# Patient Record
Sex: Male | Born: 1960 | Race: White | Hispanic: No | Marital: Married | Smoking: Never smoker
Health system: Southern US, Community
[De-identification: ages and names within clinical notes are randomized; demographics above are authoritative.]

## PROBLEM LIST (undated history)

## (undated) DIAGNOSIS — I2510 Atherosclerotic heart disease of native coronary artery without angina pectoris: Secondary | ICD-10-CM

## (undated) DIAGNOSIS — K219 Gastro-esophageal reflux disease without esophagitis: Secondary | ICD-10-CM

## (undated) DIAGNOSIS — B029 Zoster without complications: Secondary | ICD-10-CM

## (undated) DIAGNOSIS — I10 Essential (primary) hypertension: Secondary | ICD-10-CM

## (undated) DIAGNOSIS — E559 Vitamin D deficiency, unspecified: Secondary | ICD-10-CM

## (undated) DIAGNOSIS — E785 Hyperlipidemia, unspecified: Secondary | ICD-10-CM

## (undated) HISTORY — PX: CARDIAC CATHETERIZATION: SHX172

## (undated) HISTORY — DX: Atherosclerotic heart disease of native coronary artery without angina pectoris: I25.10

## (undated) HISTORY — DX: Gastro-esophageal reflux disease without esophagitis: K21.9

## (undated) HISTORY — DX: Zoster without complications: B02.9

## (undated) HISTORY — PX: VASECTOMY: SHX75

## (undated) HISTORY — DX: Hyperlipidemia, unspecified: E78.5

## (undated) HISTORY — DX: Vitamin D deficiency, unspecified: E55.9

## (undated) HISTORY — DX: Essential (primary) hypertension: I10

## (undated) HISTORY — PX: TONSILLECTOMY: SUR1361

---

## 2006-01-08 HISTORY — PX: CORONARY ANGIOPLASTY: SHX604

## 2014-05-20 ENCOUNTER — Other Ambulatory Visit: Payer: Self-pay | Admitting: Orthopedic Surgery

## 2014-05-20 DIAGNOSIS — M25571 Pain in right ankle and joints of right foot: Secondary | ICD-10-CM

## 2014-05-29 ENCOUNTER — Ambulatory Visit
Admission: RE | Admit: 2014-05-29 | Discharge: 2014-05-29 | Disposition: A | Discharge status: Home / Self Care | Payer: BLUE CROSS/BLUE SHIELD | Source: Ambulatory Visit | Attending: Orthopedic Surgery | Admitting: Orthopedic Surgery

## 2014-05-29 DIAGNOSIS — M25571 Pain in right ankle and joints of right foot: Secondary | ICD-10-CM

## 2014-06-15 ENCOUNTER — Telehealth: Payer: Self-pay

## 2014-06-15 NOTE — Telephone Encounter (Signed)
TRIED CALLING PT TO SEE ABOUT GETTING NOTES AND WHERE TO CALL FOR NOTES ALSO ABOUT FAMILY HISTORY AND MEDICATIONS. NO ANSWER. UNABLE TO LOCATE RECORDS IN OFFICE AND NUMBER LISTED WAS NOT A WORKING NUMBER.

## 2014-06-17 ENCOUNTER — Encounter: Payer: Self-pay | Admitting: Cardiology

## 2014-06-17 ENCOUNTER — Ambulatory Visit (INDEPENDENT_AMBULATORY_CARE_PROVIDER_SITE_OTHER): Payer: BLUE CROSS/BLUE SHIELD | Admitting: Cardiology

## 2014-06-17 VITALS — BP 120/82 | HR 51 | Ht 66.0 in | Wt 154.8 lb

## 2014-06-17 DIAGNOSIS — I10 Essential (primary) hypertension: Secondary | ICD-10-CM | POA: Diagnosis not present

## 2014-06-17 DIAGNOSIS — E785 Hyperlipidemia, unspecified: Secondary | ICD-10-CM | POA: Diagnosis not present

## 2014-06-17 DIAGNOSIS — I2510 Atherosclerotic heart disease of native coronary artery without angina pectoris: Secondary | ICD-10-CM

## 2014-06-17 HISTORY — DX: Hyperlipidemia, unspecified: E78.5

## 2014-06-17 HISTORY — DX: Essential (primary) hypertension: I10

## 2014-06-17 HISTORY — DX: Atherosclerotic heart disease of native coronary artery without angina pectoris: I25.10

## 2014-06-17 MED ORDER — PLAVIX 75 MG PO TABS: 75.0000 mg | ORAL_TABLET | Freq: Every day | ORAL | Status: DC

## 2014-06-17 NOTE — Patient Instructions (Signed)

## 2014-06-17 NOTE — Progress Notes (Addendum)
Cardiology Office Note   Date:  06/19/2014   ID:  Luis Zamora, DOB 17-Apr-1960, MRN [id number]  PCP:  No primary care provider on file.    Chief Complaint  Patient presents with  . New Evaluation    CAD      History of Present Illness: Luis Zamora is a 54 y.o. male who presents for evaluation of CAD.  He has a history of CAD and was treated in [place], [place] where he previously lived.  He moved to [facility] for his job.  He had a stent placed in the LAD 10/2006 due to severe atypical right sided chest pain.  He has done well since then with normal nuclear stress tests since then.  He actively exercises by walking and is going to find a gym.  He denies any chest pain, SOB, DOE, dizziness, palpitations or syncope.  He has some problems with his RLE and has had some mild swelling.      Past Medical History  Diagnosis Date  . Hypertension   . Hyperlipidemia   . Dyslipidemia, goal LDL below 70 06/17/2014  . Benign essential HTN 06/17/2014  . GERD (gastroesophageal reflux disease)   . Vitamin D deficiency   . Shingles   . CAD (coronary artery disease), native coronary artery 06/17/2014    S/p PCI of LAD 2008 in [place].  Nuclear stress test 2013 with no ischemia and EF 64%    Past Surgical History  Procedure Laterality Date  . Cardiac catheterization    . Coronary angioplasty  2008    PCI of LAD [facility]  . Vasectomy    . Tonsillectomy       Current Outpatient Prescriptions  Medication Sig Dispense Refill  . amLODipine (NORVASC) 2.5 MG tablet Take 2.5 mg by mouth daily.  0  . aspirin (BAYER ASPIRIN EC LOW DOSE) 81 MG EC tablet Take 81 mg by mouth daily. Swallow whole.    . Cholecalciferol (VITAMIN D-3) 1000 UNITS CAPS Take 1,000 Units by mouth daily.    . CRESTOR 20 MG tablet Take 20 mg by mouth daily.  0  . niacin (NIASPAN) 500 MG CR tablet Take 500 mg by mouth daily.  0  . PLAVIX 75 MG tablet Take 1 tablet (75 mg total) by mouth daily. 90  tablet 3   No current facility-administered medications for this visit.    Allergies:   Review of patient's allergies indicates no known allergies.    Social History:  The patient  reports that he has never smoked. He does not have any smokeless tobacco history on file. He reports that he drinks alcohol. He reports that he does not use illicit drugs.   Family History:  The patient's family history includes Angina in his father; Dementia in his mother; Heart disease in his father; Hyperlipidemia in his mother.    ROS:  Please see the history of present illness.   Otherwise, review of systems are positive for none.   All other systems are reviewed and negative.    PHYSICAL EXAM: VS:  BP 120/82 mmHg  Pulse 51  Ht 5\' 6"  (1.676 m)  Wt 154 lb 12.8 oz (70.217 kg)  BMI 25.00 kg/m2 , BMI Body mass index is 25 kg/(m^2). GEN: Well nourished, well developed, in no acute distress HEENT: normal Neck: no JVD, carotid bruits, or masses Cardiac: RRR; no murmurs, rubs, or gallops,no edema  Respiratory:  clear to auscultation bilaterally, normal work of breathing GI: soft, nontender, nondistended, + BS MS: no deformity or atrophy Skin: warm and dry, no rash Neuro:  Strength and sensation are intact Psych: euthymic mood, full affect   EKG:   The ekg ordered today demonstrates sinus bradycardia at 51bpm with nonspecific T wave abnormality   Recent Labs: No results found for requested labs within last 365 days.    Lipid Panel No results found for: CHOL, TRIG, HDL, CHOLHDL, VLDL, LDLCALC, LDLDIRECT    Wt Readings from Last 3 Encounters:  06/17/14 154 lb 12.8 oz (70.217 kg)       ASSESSMENT AND PLAN:  1.  ASCAD with prior PCI of the LAD and remains on ASA and Plavix. He has no angina.  He continues to exercise. 2.  Dyslipidemia - continue Crestor/Niacin.  I will get a copy from Dr. Wynelle Link 3.  HTN - well controlled on Amlodipine   Current medicines are reviewed at length with the  patient today.  The patient does not have concerns regarding medicines.  The following changes have been made:  no change  Labs/ tests ordered today: See above Assessment and Plan  Orders Placed This Encounter  Procedures  . EKG 12-Lead     Disposition:   FU with me in 6 months  Signed, Quintella Reichert, MD  06/19/2014 8:01 PM    [facility] [address] [place], [place], [place]  [address] Phone: [phone]; Fax: [phone]

## 2014-06-18 ENCOUNTER — Institutional Professional Consult (permissible substitution): Payer: Self-pay | Admitting: Cardiology

## 2014-06-19 ENCOUNTER — Encounter: Payer: Self-pay | Admitting: Cardiology

## 2014-06-19 DIAGNOSIS — E559 Vitamin D deficiency, unspecified: Secondary | ICD-10-CM | POA: Insufficient documentation

## 2014-06-19 DIAGNOSIS — B029 Zoster without complications: Secondary | ICD-10-CM | POA: Insufficient documentation

## 2014-06-19 DIAGNOSIS — K219 Gastro-esophageal reflux disease without esophagitis: Secondary | ICD-10-CM | POA: Insufficient documentation

## 2014-09-30 ENCOUNTER — Telehealth: Payer: Self-pay

## 2014-09-30 NOTE — Telephone Encounter (Signed)
THe generic for Plavix is fine

## 2014-09-30 NOTE — Telephone Encounter (Signed)
Patient st when he got his stent placed in 2008, his cardiologist told him to "never go off Plavix." Now, his insurance will not pay for name brand. Patient is extremely anxious. He wants to know, from the pharmacist, if the medication is the same or if his stent will be affected from taking generic clopidogrel.   To Kennon Rounds, [facility].

## 2014-09-30 NOTE — Telephone Encounter (Signed)
Patient called in about his Plavix and was told by his insurance company that they will no longer pay for plavix only generic. I explained this is sometimes what happens with insurance companies and he cut me off and just stated that he is uncomfortable with this and wants to know if there is a way to stay on Plavix and not generic. He would like a call back to discuss.

## 2014-09-30 NOTE — Telephone Encounter (Signed)
Discussed with pt.  Okay to change to generic.  Called [facility] pharmacy to generic.

## 2014-12-22 ENCOUNTER — Ambulatory Visit (INDEPENDENT_AMBULATORY_CARE_PROVIDER_SITE_OTHER): Payer: BLUE CROSS/BLUE SHIELD | Admitting: Cardiology

## 2014-12-22 ENCOUNTER — Encounter: Payer: Self-pay | Admitting: Cardiology

## 2014-12-22 VITALS — BP 110/80 | HR 67 | Ht 66.0 in | Wt 160.5 lb

## 2014-12-22 DIAGNOSIS — I10 Essential (primary) hypertension: Secondary | ICD-10-CM

## 2014-12-22 DIAGNOSIS — E785 Hyperlipidemia, unspecified: Secondary | ICD-10-CM | POA: Diagnosis not present

## 2014-12-22 DIAGNOSIS — I2510 Atherosclerotic heart disease of native coronary artery without angina pectoris: Secondary | ICD-10-CM | POA: Diagnosis not present

## 2014-12-22 MED ORDER — PLAVIX 75 MG PO TABS: 75.0000 mg | ORAL_TABLET | Freq: Every day | ORAL | Status: AC

## 2014-12-22 NOTE — Progress Notes (Signed)
Cardiology Office Note   Date:  12/22/2014   ID:  Luis Zamora, DOB [birth year], MRN [id number]  PCP:  Leanor Rubenstein, MD    Chief Complaint  Patient presents with  . Coronary Artery Disease      History of Present Illness: Luis Zamora is a 54 y.o. male who presents for followup of CAD. He has a history of CAD and was treated in [place], [place] where he previously lived. He had a stent placed in the LAD 10/2006 due to severe atypical right sided chest pain. He has done well since then with normal nuclear stress tests since then. He actively exercises by walking and is going to find a gym. He denies any chest pain, SOB, DOE, dizziness, palpitations or syncope.      Past Medical History  Diagnosis Date  . Hypertension   . Hyperlipidemia   . Dyslipidemia, goal LDL below 70 06/17/2014  . Benign essential HTN 06/17/2014  . GERD (gastroesophageal reflux disease)   . Vitamin D deficiency   . Shingles   . CAD (coronary artery disease), native coronary artery 06/17/2014    S/p PCI of LAD 2008 in [place].  Nuclear stress test 2013 with no ischemia and EF 64%    Past Surgical History  Procedure Laterality Date  . Cardiac catheterization    . Coronary angioplasty  2008    PCI of LAD [facility]  . Vasectomy    . Tonsillectomy       Current Outpatient Prescriptions  Medication Sig Dispense Refill  . amLODipine (NORVASC) 2.5 MG tablet Take 2.5 mg by mouth daily.  0  . aspirin (BAYER ASPIRIN EC LOW DOSE) 81 MG EC tablet Take 81 mg by mouth daily. Swallow whole.    . Cholecalciferol (VITAMIN D-3) 1000 UNITS CAPS Take 1,000 Units by mouth daily.    . CRESTOR 20 MG tablet Take 20 mg by mouth daily.  0  . niacin (NIASPAN) 500 MG CR tablet Take 500 mg by mouth daily.  0  . PLAVIX 75 MG tablet Take 1 tablet (75 mg total) by mouth daily. 90 tablet 3   No current facility-administered medications for this visit.    Allergies:   Review of patient's  allergies indicates no known allergies.    Social History:  The patient  reports that he has never smoked. He does not have any smokeless tobacco history on file. He reports that he drinks alcohol. He reports that he does not use illicit drugs.   Family History:  The patient's family history includes Angina in his father; Dementia in his mother; Heart disease in his father; Hyperlipidemia in his mother.    ROS:  Please see the history of present illness.   Otherwise, review of systems are positive for none.   All other systems are reviewed and negative.    PHYSICAL EXAM: VS:  BP 110/80 mmHg  Pulse 67  Ht  (1.676 m)  Wt 160 lb 8 oz (72.802 kg)  BMI 25.92 kg/m2  SpO2 98% , BMI Body mass index is 25.92 kg/(m^2). GEN: Well nourished, well developed, in no acute distress HEENT: normal Neck: no JVD, carotid bruits, or masses Cardiac: RRR; no murmurs, rubs, or gallops,no edema  Respiratory:  clear to auscultation bilaterally, normal work of breathing GI: soft, nontender, nondistended, + BS MS: no deformity or atrophy Skin: warm and dry, no rash Neuro:  Strength and sensation are intact Psych: euthymic mood, full affect   EKG:  EKG is not ordered today.    Recent Labs: No results found for requested labs within last 365 days.    Lipid Panel No results found for: CHOL, TRIG, HDL, CHOLHDL, VLDL, LDLCALC, LDLDIRECT    Wt Readings from Last 3 Encounters:  12/22/14 160 lb 8 oz (72.802 kg)  06/17/14 154 lb 12.8 oz (70.217 kg)      ASSESSMENT AND PLAN:  1. ASCAD with prior PCI of the LAD and remains on ASA and Plavix. He has no angina. He continues to exercise. 2. Dyslipidemia - continue Crestor/Niacin. I will get a copy from Dr. Wynelle LinkSun 3. HTN - well controlled on Amlodipine   Current medicines are reviewed at length with the patient today.  The patient does not have concerns regarding medicines.  The following changes have been made:  no change  Labs/ tests ordered  today: See above Assessment and Plan No orders of the defined types were placed in this encounter.     Disposition:   FU with me in 1 year  Signed, Quintella ReichertURNER,Jocelyne Reinertsen R, MD  12/22/2014 8:54 AM    [facility] [address], [place], [place]  [address] Phone: [phone]; Fax: [phone]) [phone]

## 2014-12-22 NOTE — Patient Instructions (Signed)

## 2014-12-29 ENCOUNTER — Encounter: Payer: Self-pay | Admitting: Cardiology

## 2015-04-27 ENCOUNTER — Encounter: Payer: Self-pay | Admitting: Nurse Practitioner

## 2015-04-27 ENCOUNTER — Telehealth: Payer: Self-pay | Admitting: Cardiology

## 2015-04-27 NOTE — Progress Notes (Signed)
Patient ID: Luis Zamora, male   DOB: 08-31-1960, 55 y.o.   MRN: [id number]   I was approached by the director of HeartCare to call this gentleman in regards to "getting established with this practice".   I was told he had seen Dr. Mayford Knifeurner a long time ago.   I called and left the patient a message on his cell phone to call this office to discuss what his needs were and to see about getting him an appointment.  I then checked the patient's chart - he is a current patient of Dr. Norris Crossurner's and was seen here in December.   Dr. Mayford Knifeurner was in the reading room - I have relayed this message to her.   She will assume further responsibility of this issue.   Rosalio MacadamiaLori C. Katora Fini, RN, ANP-C [facility] [address] [place], [place]  [address] [phone]

## 2015-04-27 NOTE — Telephone Encounter (Signed)
Spoke with pt and asked about a message received about getting him established in our practice. Advised pt that I did see where he had seen Dr. Mayford Knifeurner in December and she said to follow up in 1 year. Pt states that someone had called him at 1:25pm and he was just trying to figure out who called him. Informed pt that I wasn't sure who called but I would try to find out.   Asked pt if he needed an appt now or if he had any issues with Dr. Mayford Knifeurner and he stated that he did not need an appt or have any issues with Dr. Mayford Knifeurner and planned to see her back at follow up. Advised pt I would call back if I needed to in regards to who called him, otherwise we would see him back at his 1 year follow up. Pt verbalized understanding and was appreciative for call back. Made Dr. Mayford Knifeurner aware and she spoke with Lawanna KobusAngel, our director.

## 2015-04-27 NOTE — Progress Notes (Signed)
See prior note.  Mr. Hollice EspyMcCreary has called back - I reiterated my first message.   He is not having any cardiac issues.   He wanted to confirm that Dr. Mayford Knifeurner was a part [facility] and I said yes.  Asked how I could help him multiple times, he could not give me a specific answer. Just stated that he did not feel like his care was "as it should be" but had no problem with Dr. Mayford Knifeurner.   He said he would call back once he decided what it was that he specifically needed - I informed him that I would be out of the office until May 1st but he could call back and speak to Dr. Norris Crossurner's staff if he needs further assistance.   Rosalio MacadamiaLori C. Kynleigh Artz, RN, ANP-C [facility] [address] [place], [place]  [address] [phone]

## 2015-05-03 ENCOUNTER — Telehealth: Payer: Self-pay

## 2015-05-03 NOTE — Telephone Encounter (Signed)
Call back received from patient. Pt stated his previous cardiologist "saved his life."  She would take time to answer his questions and provide information about treatment plans, medications etc.. This is the level of care he is comfortable with.  Pt stated he was unaware of the plan to change his Plavix to generic. Write explained the change to generic is automatic based on where the physician signs the prescription.  This is usually a financial savings for our patients.   Writer explained per our pharmacist there should not be any decreased efficacy of generic versus brand name Plavix, however writer offered to explore the options of prescription override. .  Patient verbalized understanding stated, "he was told never to stop his plavix and when he saw the generic it alarmed him." He stated he is tolerating the generic and was ok with continuing to take it.     Patient would like another cardiologist, not because of concerns regarding the care he received. Writer offered to explore the option of another physician within our practice and will follow up with patient within 24 hrs to let him know of progress/plan.  Patient also requested to be seen every 6 months versus every 12 months.

## 2015-05-05 NOTE — Telephone Encounter (Signed)
Late entry (05/04/15)  Dr Clifton JamesMcAlhany agreed to see patient, per patient's preference for 6 month followup appointment scheduled for 08/01/15.  Pt notified and appreciative .  Dr Mayford Knifeurner aware and agreed to physician change. Jim Likeeri Suits [facility] RN CCM

## 2015-06-02 ENCOUNTER — Telehealth: Payer: Self-pay | Admitting: Cardiology

## 2015-06-02 NOTE — Telephone Encounter (Signed)
New message     Request for surgical clearance:  1. What type of surgery is being performed? Rt ankle surgery  2. When is this surgery scheduled? May 31 st  3. Are there any medications that need to be held prior to surgery and how long? Pt has been advised to continue blood thinner by orthopedic surgeon  4. Name of physician performing surgery? Dr. Earl LitesGregory  5. What is your office phone and fax number? Office number [phone]/fax number [phone]   The office was needing the clearance form faxed to them please

## 2015-06-02 NOTE — Telephone Encounter (Signed)
Patient low risk from cardiac standpoint for surgery.  OK to hold Plavix and ASA for 1 week prior to surgery and restart when ok with surgery

## 2015-06-03 NOTE — Telephone Encounter (Signed)
Printed and placed in Medical Records "to be faxed" bin. 

## 2015-06-07 HISTORY — PX: FOOT SURGERY: SHX648

## 2015-06-08 ENCOUNTER — Emergency Department (HOSPITAL_COMMUNITY)
Admission: EM | Admit: 2015-06-08 | Discharge: 2015-06-08 | Disposition: A | Discharge status: Home / Self Care | Payer: BLUE CROSS/BLUE SHIELD | Attending: Emergency Medicine | Admitting: Emergency Medicine

## 2015-06-08 ENCOUNTER — Encounter (HOSPITAL_COMMUNITY): Payer: Self-pay | Admitting: *Deleted

## 2015-06-08 DIAGNOSIS — M25571 Pain in right ankle and joints of right foot: Secondary | ICD-10-CM

## 2015-06-08 DIAGNOSIS — Z7982 Long term (current) use of aspirin: Secondary | ICD-10-CM | POA: Insufficient documentation

## 2015-06-08 DIAGNOSIS — R001 Bradycardia, unspecified: Secondary | ICD-10-CM | POA: Diagnosis not present

## 2015-06-08 DIAGNOSIS — I951 Orthostatic hypotension: Secondary | ICD-10-CM | POA: Insufficient documentation

## 2015-06-08 DIAGNOSIS — G8918 Other acute postprocedural pain: Secondary | ICD-10-CM | POA: Insufficient documentation

## 2015-06-08 DIAGNOSIS — D649 Anemia, unspecified: Secondary | ICD-10-CM | POA: Insufficient documentation

## 2015-06-08 DIAGNOSIS — E785 Hyperlipidemia, unspecified: Secondary | ICD-10-CM | POA: Insufficient documentation

## 2015-06-08 DIAGNOSIS — Z79899 Other long term (current) drug therapy: Secondary | ICD-10-CM | POA: Insufficient documentation

## 2015-06-08 DIAGNOSIS — R55 Syncope and collapse: Secondary | ICD-10-CM | POA: Diagnosis present

## 2015-06-08 DIAGNOSIS — I2510 Atherosclerotic heart disease of native coronary artery without angina pectoris: Secondary | ICD-10-CM | POA: Insufficient documentation

## 2015-06-08 DIAGNOSIS — I10 Essential (primary) hypertension: Secondary | ICD-10-CM | POA: Diagnosis not present

## 2015-06-08 LAB — CBC WITH DIFFERENTIAL/PLATELET
Basophils Absolute: 0 10*3/uL (ref 0.0–0.1)
Basophils Relative: 0 %
Eosinophils Absolute: 0.1 10*3/uL (ref 0.0–0.7)
Eosinophils Relative: 1 %
HCT: 38.6 % — ABNORMAL LOW (ref 39.0–52.0)
Hemoglobin: 12.4 g/dL — ABNORMAL LOW (ref 13.0–17.0)
Lymphocytes Relative: 11 %
Lymphs Abs: 1.5 10*3/uL (ref 0.7–4.0)
MCH: 28.4 pg (ref 26.0–34.0)
MCHC: 32.1 g/dL (ref 30.0–36.0)
MCV: 88.5 fL (ref 78.0–100.0)
Monocytes Absolute: 1.5 10*3/uL — ABNORMAL HIGH (ref 0.1–1.0)
Monocytes Relative: 11 %
Neutro Abs: 10.1 10*3/uL — ABNORMAL HIGH (ref 1.7–7.7)
Neutrophils Relative %: 77 %
Platelets: 246 10*3/uL (ref 150–400)
RBC: 4.36 MIL/uL (ref 4.22–5.81)
RDW: 13 % (ref 11.5–15.5)
WBC: 13.2 10*3/uL — ABNORMAL HIGH (ref 4.0–10.5)

## 2015-06-08 LAB — BASIC METABOLIC PANEL
Anion gap: 7 (ref 5–15)
BUN: 9 mg/dL (ref 6–20)
CO2: 29 mmol/L (ref 22–32)
Calcium: 8.9 mg/dL (ref 8.9–10.3)
Chloride: 102 mmol/L (ref 101–111)
Creatinine, Ser: 0.85 mg/dL (ref 0.61–1.24)
GFR calc Af Amer: >60 mL/min (ref >60)
GFR calc non Af Amer: >60 mL/min (ref >60)
Glucose, Bld: 114 mg/dL — ABNORMAL HIGH (ref 65–99)
Potassium: 3.7 mmol/L (ref 3.5–5.1)
Sodium: 138 mmol/L (ref 135–145)

## 2015-06-08 LAB — I-STAT TROPONIN, ED: Troponin i, poc: 0 ng/mL (ref 0.00–0.08)

## 2015-06-08 MED ORDER — MORPHINE SULFATE (PF) 4 MG/ML IV SOLN
4.0000 mg | Freq: Once | INTRAVENOUS | Status: AC
  Administered 2015-06-08: 4 mg via INTRAVENOUS
  Filled 2015-06-08: qty 1

## 2015-06-08 MED ORDER — SODIUM CHLORIDE 0.9 % IV BOLUS (SEPSIS)
1000.0000 mL | Freq: Once | INTRAVENOUS | Status: AC
  Administered 2015-06-08: 1000 mL via INTRAVENOUS

## 2015-06-08 NOTE — ED Provider Notes (Signed)
CSN: [id number]     Arrival date & time 06/08/15  1339 History   First MD Initiated Contact with Patient 06/08/15 1355     Chief Complaint  Patient presents with  . Near Syncope     (Consider location/radiation/quality/duration/timing/severity/associated sxs/prior Treatment) HPI Comments: Pierce CraneWilliam Bogusz is a 55 y.o. male with a PMHx of HTN, HLD, GERD, shingles, CAD s/p PCI in 2008 with no prior MI and negative follow up nuc med stress testing, who presents to the ED with complaints of syncopal episode prior to arrival. Patient had right ankle surgery yesterday by Dr. Lajoyce Cornersuda, had 2 screws placed and debridement of his ankle, was sent home and felt well since then, but around noon today his right ankle developed severe pain around the time he was due for more pain medication. He went to the restroom using his scooter, after using the restroom he got up back onto his scooter and subsequently felt lightheaded and diaphoretic, and had a syncopal episode for approximately 20 seconds, his wife was able to catch him and lower him down to the ground therefore she denies any head injury during this syncopal episode. He states that once he was on the ground he felt better, EMS was called and he initially did not want to be transported, but when they got him up he had positive orthostatic vitals and recurrent lightheadedness with attempting to stand so he agreed to come to the ER. They gave him 500 mL bolus in route and he states that he feels somewhat better. He states that he had taken his Percocet 5-3 25 mg tablet around 8:30 AM and was due for another pill around the time that this syncopal episode occurred, around 1:30 PM he was advised by EMS to take the pain medication while he was being transported which did provide some relief of his pain. He describes the R ankle pain is 9/10 constant sharp and throbbing nonradiating ankle pain, worse with movement, and somewhat improved with Percocet. He has not been taking  anything else for pain.  He is still on Plavix and aspirin, he did not stop taking Plavix prior to surgery except for on the morning of surgery. He is relatively athletic and admits that his resting heart rate typically in the upper 50s-lower 60s range. He sees Dr. Carolanne Grumblingracy Turner as his cardiologist and he was cleared prior to surgery from a cardiac standpoint. He has never had a heart attack, states that he had a cardiac cath that showed stenosis of the LAD and so he had a drug-eluting stent placed in 2008. He has had negative stress tests since then. He does have a family history of CAD but no family history of MI or DVT/PE. He is a nonsmoker.  He denies any fevers, chills, chest pain, shortness breath, headache, vision changes, leg swelling, recent travel or immobilization, personal or family history of DVT/PE, cough, abdominal pain, nausea vomiting, diarrhea, constipation, dysuria, hematuria, numbness, tingling, or focal weakness.    Patient is a 55 y.o. male presenting with near-syncope. The history is provided by the patient, a significant other, the spouse and the EMS personnel. No language interpreter was used.  Near Syncope This is a new problem. The current episode started today. The problem occurs rarely. The problem has been gradually improving. Associated symptoms include arthralgias (R ankle) and diaphoresis. Pertinent negatives include no abdominal pain, chest pain, chills, coughing, fever, headaches, myalgias, nausea, numbness, urinary symptoms, visual change, vomiting or weakness. The symptoms are aggravated by  standing. He has tried position changes ( bolus) for the symptoms. The treatment provided mild relief.    Past Medical History  Diagnosis Date  . Hypertension   . Hyperlipidemia   . Dyslipidemia, goal LDL below 70 06/17/2014  . Benign essential HTN 06/17/2014  . GERD (gastroesophageal reflux disease)   . Vitamin D deficiency   . Shingles   . CAD (coronary artery disease),  native coronary artery 06/17/2014    S/p PCI of LAD 2008 in [place].  Nuclear stress test 2013 with no ischemia and EF 64%   Past Surgical History  Procedure Laterality Date  . Cardiac catheterization    . Coronary angioplasty  2008    PCI of LAD [facility]  . Vasectomy    . Tonsillectomy     Family History  Problem Relation Age of Onset  . Hyperlipidemia Mother   . Dementia Mother   . Angina Father   . Heart disease Father    Social History  Substance Use Topics  . Smoking status: Never Smoker   . Smokeless tobacco: None  . Alcohol Use: 0.0 oz/week    0 Standard drinks or equivalent per week     Comment: occasional    Review of Systems  Constitutional: Positive for diaphoresis. Negative for fever and chills.  HENT: Negative for facial swelling (no head inj).   Eyes: Negative for visual disturbance.  Respiratory: Negative for cough and shortness of breath.   Cardiovascular: Positive for near-syncope. Negative for chest pain and leg swelling.  Gastrointestinal: Negative for nausea, vomiting, abdominal pain, diarrhea and constipation.  Genitourinary: Negative for dysuria and hematuria.  Musculoskeletal: Positive for arthralgias (R ankle). Negative for myalgias.  Skin: Negative for color change.  Allergic/Immunologic: Negative for immunocompromised state.  Neurological: Positive for syncope and light-headedness. Negative for weakness, numbness and headaches.  Hematological: Bruises/bleeds easily (on plavix).  Psychiatric/Behavioral: Negative for confusion.   10 Systems reviewed and are negative for acute change except as noted in the HPI.    Allergies  Review of patient's allergies indicates no known allergies.  Home Medications   Prior to Admission medications   Medication Sig Start Date End Date Taking? Authorizing Provider  amLODipine (NORVASC) 2.5 MG tablet Take 2.5 mg by mouth daily. 04/28/14   Historical Provider, MD  aspirin (BAYER ASPIRIN EC LOW DOSE) 81 MG  EC tablet Take 81 mg by mouth daily. Swallow whole.    Historical Provider, MD  Cholecalciferol (VITAMIN D-3) 1000 UNITS CAPS Take 1,000 Units by mouth daily.    Historical Provider, MD  CRESTOR 20 MG tablet Take 20 mg by mouth daily. 05/19/14   Historical Provider, MD  niacin (NIASPAN) 500 MG CR tablet Take 500 mg by mouth daily. 04/28/14   Historical Provider, MD  PLAVIX 75 MG tablet Take 1 tablet (75 mg total) by mouth daily. 12/22/14   Quintella Reichert, MD   BP 125/72 mmHg  Pulse 51  Temp(Src) 98.8 F (37.1 C) (Oral)  Resp 21  SpO2 100% Physical Exam  Constitutional: He is oriented to person, place, and time. Vital signs are normal. He appears well-developed and well-nourished.  Non-toxic appearance. No distress.  Afebrile, nontoxic, NAD  HENT:  Head: Normocephalic and atraumatic.  Mouth/Throat: Oropharynx is clear and moist. Mucous membranes are dry.  Mildly dry mucous membranes [place]/AT  Eyes: Conjunctivae and EOM are normal. Pupils are equal, round, and reactive to light. Right eye exhibits no discharge. Left eye exhibits no discharge.  PERRL, EOMI,  no nystagmus, no visual field deficits   Neck: Normal range of motion. Neck supple.  Cardiovascular: Regular rhythm, normal heart sounds and intact distal pulses.  Bradycardia present.  Exam reveals no gallop and no friction rub.   No murmur heard. Mildly bradycardic with HR 58-62 during exam, which is baseline per pt and per chart review. Reg rhythm, nl s1/s2, no m/r/g, distal pulses intact, no pedal edema  Pulmonary/Chest: Effort normal and breath sounds normal. No respiratory distress. He has no decreased breath sounds. He has no wheezes. He has no rhonchi. He has no rales.  Abdominal: Soft. Normal appearance and bowel sounds are normal. He exhibits no distension. There is no tenderness. There is no rigidity, no rebound, no guarding, no CVA tenderness, no tenderness at McBurney's point and negative Murphy's sign.  Musculoskeletal: Normal  range of motion.  MAE x4, R ankle/foot wrapped with ace wrap but still able to access his DP pulses which are intact, adequate cap refill and able to wiggle all digits, no erythema noted around the bandages, no warmth, no swelling noted around the bandages Strength and sensation grossly intact Distal pulses intact Unable to assess gait due to R ankle post-surgery condition No pedal edema, neg homan's bilaterally  Neurological: He is alert and oriented to person, place, and time. He has normal strength. No cranial nerve deficit or sensory deficit. Coordination normal. GCS eye subscore is 4. GCS verbal subscore is 5. GCS motor subscore is 6.  CN 2-12 grossly intact A&O x4 GCS 15 Sensation and strength intact Coordination with finger-to-nose WNL Neg pronator drift   Skin: Skin is warm, dry and intact. No rash noted.  Psychiatric: He has a normal mood and affect.  Nursing note and vitals reviewed.   ED Course  Procedures (including critical care time) Labs Review Labs Reviewed  CBC WITH DIFFERENTIAL/PLATELET - Abnormal; Notable for the following:    WBC 13.2 (*)    Hemoglobin 12.4 (*)    HCT 38.6 (*)    Neutro Abs 10.1 (*)    Monocytes Absolute 1.5 (*)    All other components within normal limits  BASIC METABOLIC PANEL - Abnormal; Notable for the following:    Glucose, Bld 114 (*)    All other components within normal limits  I-STAT TROPOININ, ED    Imaging Review No results found. I have personally reviewed and evaluated these images and lab results as part of my medical decision-making.   EKG Interpretation   Date/Time:  Wednesday Jun 08 2015 13:55:28 EDT Ventricular Rate:  60 PR Interval:  163 QRS Duration: 98 QT Interval:  391 QTC Calculation: 391 R Axis:   -23 Text Interpretation:  Sinus rhythm Borderline left axis deviation  Borderline T abnormalities, inferior leads T wave inversion in III, no  previous available for comparison Confirmed by LITTLE MD, RACHEL  [phone])  on 06/08/2015 1:58:21 PM      MDM   Final diagnoses:  Vasovagal syncope  Post-op pain  Right ankle pain  Orthostasis  Anemia, unspecified anemia type  Chronic sinus bradycardia    55 y.o. male here with syncopal episode prior to arrival, had severe R ankle pain and went to the restroom, got up into his scooter and felt lightheaded, diaphoretic, and syncopized for 20 seconds, caught by his wife, no head inj. EMS came and when he stood up he felt lightheaded again, pt tells me his BP dropped and describes orthostatic VS, was given bolus which he states made him feel  better. On exam, no focal neuro deficits, all extremities NVI with soft compartments, neg homan's, no LE swelling, HR 58-62 which is his baseline, no hypoxia or tachycardia. Doubt cardiac etiology, doubt PE. Likely vasovagal vs orthostatic syncope. Mildly dry mucous membranes. Will give fluids, check basic labs and trop. EKG without concerning findings, and overall unchanged from his prior EKG in 06/2014 at his cardiologist's office. Will give him pain medication to help augment his PO pain meds, discussed that he will need to add low-dose Ibuprofen to help supplement his pain meds for postop pain, I feel he could safely use ibuprofen as long as he has no signs of bleeding. Will reassess after pain meds and fluids. Will apply ice to his ankle as well.   4:06 PM Trop neg. CBC w/diff showing mildly elevated WBC 13.2 likely from stress demargination; mild anemia Hgb 12.4/Hct 38.6 with no prior labs to compare to. BMP WNL. Pt feeling much better, able to sit upright without difficulty and without recurrent symptoms. Pain doing better. Discussed starting ibuprofen to supplement his narcotic pain meds, and taking pain meds on a schedule for a few days while he's recovering from his ankle surgery. RICE discussed. F/up with Dr. Lajoyce Corners this week for ongoing management of ankle pain. F/up with PCP in 5-7 days for recheck and to have  ongoing management of his slight anemia (could be transient from recent surgery, vs chronic). F/up with cardiologist in 5-7 days for recheck and ongoing management of his cardiac conditions, including his chronic sinus bradycardia (HR at discharge 66, similar to all prior outpatient visits). Discussed importance of staying hydrated, not getting up too fast when he changes positions given that he's under the influence of narcotics which can worsen lightheadedness, and staying on top of his pain control. I explained the diagnosis and have given explicit precautions to return to the ER including for any other new or worsening symptoms. The patient understands and accepts the medical plan as it's been dictated and I have answered their questions. Discharge instructions concerning home care and prescriptions have been given. The patient is STABLE and is discharged to home in good condition.   BP 140/80 mmHg  Pulse 66  Temp(Src) 98.8 F (37.1 C) (Oral)  Resp 21  SpO2 99%  Meds ordered this encounter  Medications  . sodium chloride 0.9 % bolus 1,000 mL    Sig:   . morphine 4 MG/ML injection 4 mg    SigAllen Derry, PA-C 06/08/15 1627  Laurence Spates, MD 06/08/15 [phone]

## 2015-06-08 NOTE — ED Notes (Signed)
Declined W/C at D/C and was escorted to lobby by RN. 

## 2015-06-08 NOTE — ED Notes (Signed)
PT last dose of home pain meds 1330.

## 2015-06-08 NOTE — Discharge Instructions (Signed)
You likely had a vasovagal or orthostatic syncopal episode, often this is due to painful stimulus or not staying adequately hydrated, but can also be due to narcotics making you lightheaded and quick position changes (such as going from laying to sitting/standing too quickly). Drink PLENTY of water to stay hydrated. Avoid quick position changes, slowly rise from laying to sitting or standing. Keep the ace wrap on your ankle as instructed by your orthopedic surgeon. Use your home scooter as directed by your orthopedic surgeon. Ice and elevate ankle throughout the day, keeping the ice on your ankle for no more than 20 minutes every hour. Continue taking your home percocet as directed as needed for pain, and for the first few days after surgery you should consider taking it on a schedule such as every 4 hours instead of waiting for the pain to worsen before taking it. Do not drive or operate machinery with pain medication use. Use additional ibuprofen 400mg  every 8 hours on a schedule for the first few days, and then as needed thereafter. Keep a close eye on any bleeding in your stool or other areas of your body, if this occurs then stop taking ibuprofen and call your doctor. Your blood counts today showed slight anemia, which could be temporary from after surgery, but you will need to follow up with your primary doctor to ensure this improves with time. Follow up with your regular doctor in 5-7 days for recheck of symptoms and ongoing management, and with your orthopedic surgeon in the next 5-7 days for ongoing management of your ankle surgery. Follow up with your cardiologist in the next 5-7 days for ongoing evaluation of your episode today and for your heart health. Return to the ER for changes or worsening symptoms.  Vasovagal Syncope Syncope, commonly known as fainting, is a temporary loss of consciousness. It occurs when the blood flow to the brain is reduced. Vasovagal syncope (also called neurocardiogenic  syncope) is a fainting spell in which the blood flow to the brain is reduced because of a sudden drop in heart rate and blood pressure. Vasovagal syncope occurs when the brain and the cardiovascular system (blood vessels) do not adequately communicate and respond to each other. This is the most common cause of fainting. It often occurs in response to fear or some other type of emotional or physical stress. The body has a reaction in which the heart starts beating too slowly or the blood vessels expand, reducing blood pressure. This type of fainting spell is generally considered harmless. However, injuries can occur if a person takes a sudden fall during a fainting spell.  CAUSES  Vasovagal syncope occurs when a person's blood pressure and heart rate decrease suddenly, usually in response to a trigger. Many things and situations can trigger an episode. Some of these include:   Pain.   Fear.   The sight of blood or medical procedures, such as blood being drawn from a vein.   Common activities, such as coughing, swallowing, stretching, or going to the bathroom.   Emotional stress.   Prolonged standing, especially in a warm environment.   Lack of sleep or rest.   Prolonged lack of food.   Prolonged lack of fluids.   Recent illness.  The use of certain drugs that affect blood pressure, such as cocaine, alcohol, marijuana, inhalants, and opiates.  SYMPTOMS  Before the fainting episode, you may:   Feel dizzy or light headed.   Become pale.  Sense that you are going  to faint.   Feel like the room is spinning.   Have tunnel vision, only seeing directly in front of you.   Feel sick to your stomach (nauseous).   See spots or slowly lose vision.   Hear ringing in your ears.   Have a headache.   Feel warm and sweaty.   Feel a sensation of pins and needles. During the fainting spell, you will generally be unconscious for no longer than a couple minutes before  waking up and returning to normal. If you get up too quickly before your body can recover, you may faint again. Some twitching or jerky movements may occur during the fainting spell.  DIAGNOSIS  Your health care provider will ask about your symptoms, take a medical history, and perform a physical exam. Various tests may be done to rule out other causes of fainting. These may include blood tests and tests to check the heart, such as electrocardiography, echocardiography, and possibly an electrophysiology study. When other causes have been ruled out, a test may be done to check the body's response to changes in position (tilt table test). TREATMENT  Most cases of vasovagal syncope do not require treatment. Your health care provider may recommend ways to avoid fainting triggers and may provide home strategies for preventing fainting. If you must be exposed to a possible trigger, you can drink additional fluids to help reduce your chances of having an episode of vasovagal syncope. If you have warning signs of an oncoming episode, you can respond by positioning yourself favorably (lying down). If your fainting spells continue, you may be given medicines to prevent fainting. Some medicines may help make you more resistant to repeated episodes of vasovagal syncope. Special exercises or compression stockings may be recommended. In rare cases, the surgical placement of a pacemaker is considered. HOME CARE INSTRUCTIONS   Learn to identify the warning signs of vasovagal syncope.   Sit or lie down at the first warning sign of a fainting spell. If sitting, put your head down between your legs. If you lie down, swing your legs up in the air to increase blood flow to the brain.   Avoid hot tubs and saunas.  Avoid prolonged standing.  Drink enough fluids to keep your urine clear or pale yellow. Avoid caffeine.  Increase salt in your diet as directed by your health care provider.   If you have to stand for a  long time, perform movements such as:   Crossing your legs.   Flexing and stretching your leg muscles.   Squatting.   Moving your legs.   Bending over.   Only take over-the-counter or prescription medicines as directed by your health care provider. Do not suddenly stop any medicines without asking your health care provider first. SEEK MEDICAL CARE IF:   Your fainting spells continue or happen more frequently in spite of treatment.   You lose consciousness for more than a couple minutes.  You have fainting spells during or after exercising or after being startled.   You have new symptoms that occur with the fainting spells, such as:   Shortness of breath.  Chest pain.   Irregular heartbeat.   You have episodes of twitching or jerky movements that last longer than a few seconds.  You have episodes of twitching or jerky movements without obvious fainting. SEEK IMMEDIATE MEDICAL CARE IF:   You have injuries or bleeding after a fainting spell.   You have episodes of twitching or jerky movements that last  longer than 5 minutes.   You have more than one spell of twitching or jerky movements before returning to consciousness after fainting.   This information is not intended to replace advice given to you by your health care provider. Make sure you discuss any questions you have with your health care provider.   Document Released: 12/12/2011 Document Revised: 05/11/2014 Document Reviewed: 12/12/2011 Elsevier Interactive Patient Education 2016 [employer]  Syncope Syncope means a person passes out (faints). The person usually wakes up in less than 5 minutes. It is important to seek medical care for syncope. HOME CARE  Have someone stay with you until you feel normal.  Do not drive, use machines, or play sports until your doctor says it is okay.  Keep all doctor visits as told.  Lie down when you feel like you might pass out. Take deep breaths. Wait until  you feel normal before standing up.  Drink enough fluids to keep your pee (urine) clear or pale yellow.  If you take blood pressure or heart medicine, get up slowly. Take several minutes to sit and then stand. GET HELP RIGHT AWAY IF:   You have a severe headache.  You have pain in the chest, belly (abdomen), or back.  You are bleeding from the mouth or butt (rectum).  You have black or tarry poop (stool).  You have an irregular or very fast heartbeat.  You have pain with breathing.  You keep passing out, or you have shaking (seizures) when you pass out.  You pass out when sitting or lying down.  You feel confused.  You have trouble walking.  You have severe weakness.  You have vision problems. If you fainted, call for help (911 in [place] Do not drive yourself to the hospital.   This information is not intended to replace advice given to you by your health care provider. Make sure you discuss any questions you have with your health care provider.   Document Released: 06/13/2007 Document Revised: 05/11/2014 Document Reviewed: 02/23/2011 Elsevier Interactive Patient Education 2016 [employer] Inc[employer]  Orthostatic Hypotension Orthostatic hypotension is a sudden drop in blood pressure. It happens when you quickly stand up from a seated or lying position. You may feel dizzy or light-headed. This can last for just a few seconds or for up to a few minutes. It is usually not a serious problem. However, if this happens frequently or gets worse, it can be a sign of something more serious. CAUSES  Different things can cause orthostatic hypotension, including:   Loss of body fluids (dehydration).  Medicines that lower blood pressure.  Sudden changes in posture, such as standing up quickly after you have been sitting or lying down.  Taking too much of your medicine. SIGNS AND SYMPTOMS   Light-headedness or dizziness.   Fainting or near-fainting.   A fast heart rate.    Weakness.   Feeling tired (fatigue).  DIAGNOSIS  Your health care provider may do several things to help diagnose your condition and identify the cause. These may include:   Taking a medical history and doing a physical exam.  Checking your blood pressure. Your health care provider will check your blood pressure when you are:  Lying down.  Sitting.  Standing.  Using tilt table testing. In this test, you lie down on a table that moves from a lying position to a standing position. You will be strapped onto the table. This test monitors your blood pressure and heart rate when you are  in different positions. TREATMENT  Treatment will vary depending on the cause. Possible treatments include:   Changing the dosage of your medicines.  Wearing compression stockings on your lower legs.  Standing up slowly after sitting or lying down.  Eating more salt.  Eating frequent, small meals.  In some cases, getting IV fluids.  Taking medicine to enhance fluid retention. HOME CARE INSTRUCTIONS  Only take over-the-counter or prescription medicines as directed by your health care provider.  Follow your health care provider's instructions for changing the dosage of your current medicines.  Do not stop or adjust your medicine on your own.  Stand up slowly after sitting or lying down. This allows your body to adjust to the different position.  Wear compression stockings as directed.  Eat extra salt as directed.  Do not add extra salt to your diet unless directed to by your health care provider.  Eat frequent, small meals.  Avoid standing suddenly after eating.  Avoid hot showers or excessive heat as directed by your health care provider.  Keep all follow-up appointments. SEEK MEDICAL CARE IF:  You continue to feel dizzy or light-headed after standing.  You feel groggy or confused.  You feel cold, clammy, or sick to your stomach (nauseous).  You have blurred  vision.  You feel short of breath. SEEK IMMEDIATE MEDICAL CARE IF:   You faint after standing.  You have chest pain.  You have difficulty breathing.   You lose feeling or movement in your arms or legs.   You have slurred speech or difficulty talking, or you are unable to talk.  MAKE SURE YOU:   Understand these instructions.  Will watch your condition.  Will get help right away if you are not doing well or get worse.   This information is not intended to replace advice given to you by your health care provider. Make sure you discuss any questions you have with your health care provider.   Document Released: 12/15/2001 Document Revised: 12/30/2012 Document Reviewed: 10/17/2012 Elsevier Interactive Patient Education 2016 Elsevier Inc.  Cryotherapy Cryotherapy is when you put ice on your injury. Ice helps lessen pain and puffiness (swelling) after an injury. Ice works the best when you start using it in the first 24 to 48 hours after an injury. HOME CARE  Put a dry or damp towel between the ice pack and your skin.  You may press gently on the ice pack.  Leave the ice on for no more than 10 to 20 minutes at a time.  Check your skin after 5 minutes to make sure your skin is okay.  Rest at least 20 minutes between ice pack uses.  Stop using ice when your skin loses feeling (numbness).  Do not use ice on someone who cannot tell you when it hurts. This includes small children and people with memory problems (dementia). GET HELP RIGHT AWAY IF:  You have white spots on your skin.  Your skin turns blue or pale.  Your skin feels waxy or hard.  Your puffiness gets worse. MAKE SURE YOU:   Understand these instructions.  Will watch your condition.  Will get help right away if you are not doing well or get worse.   This information is not intended to replace advice given to you by your health care provider. Make sure you discuss any questions you have with your  health care provider.   Document Released: 06/13/2007 Document Revised: 03/19/2011 Document Reviewed: 08/17/2010 Elsevier Interactive Patient Education 2016  [employer] Inc[employer]  Anemia, Nonspecific Anemia is a condition in which the concentration of red blood cells or hemoglobin in the blood is below normal. Hemoglobin is a substance in red blood cells that carries oxygen to the tissues of the body. Anemia results in not enough oxygen reaching these tissues.  CAUSES  Common causes of anemia include:   Excessive bleeding. Bleeding may be internal or external. This includes excessive bleeding from periods (in women) or from the intestine.   Poor nutrition.   Chronic kidney, thyroid, and liver disease.  Bone marrow disorders that decrease red blood cell production.  Cancer and treatments for cancer.  HIV, AIDS, and their treatments.  Spleen problems that increase red blood cell destruction.  Blood disorders.  Excess destruction of red blood cells due to infection, medicines, and autoimmune disorders. SIGNS AND SYMPTOMS   Minor weakness.   Dizziness.   Headache.  Palpitations.   Shortness of breath, especially with exercise.   Paleness.  Cold sensitivity.  Indigestion.  Nausea.  Difficulty sleeping.  Difficulty concentrating. Symptoms may occur suddenly or they may develop slowly.  DIAGNOSIS  Additional blood tests are often needed. These help your health care provider determine the best treatment. Your health care provider will check your stool for blood and look for other causes of blood loss.  TREATMENT  Treatment varies depending on the cause of the anemia. Treatment can include:   Supplements of iron, vitamin B12, or folic acid.   Hormone medicines.   A blood transfusion. This may be needed if blood loss is severe.   Hospitalization. This may be needed if there is significant continual blood loss.   Dietary changes.  Spleen removal. HOME CARE  INSTRUCTIONS Keep all follow-up appointments. It often takes many weeks to correct anemia, and having your health care provider check on your condition and your response to treatment is very important. SEEK IMMEDIATE MEDICAL CARE IF:   You develop extreme weakness, shortness of breath, or chest pain.   You become dizzy or have trouble concentrating.  You develop heavy vaginal bleeding.   You develop a rash.   You have bloody or black, tarry stools.   You faint.   You vomit up blood.   You vomit repeatedly.   You have abdominal pain.  You have a fever or persistent symptoms for more than 2-3 days.   You have a fever and your symptoms suddenly get worse.   You are dehydrated.  MAKE SURE YOU:  Understand these instructions.  Will watch your condition.  Will get help right away if you are not doing well or get worse.   This information is not intended to replace advice given to you by your health care provider. Make sure you discuss any questions you have with your health care provider.   Document Released: 02/02/2004 Document Revised: 08/27/2012 Document Reviewed: 06/20/2012 Elsevier Interactive Patient Education [employer]

## 2015-06-08 NOTE — ED Notes (Addendum)
Pt reports he was going to bathroom ,wife assisting Pt at time. Pt had sever pain in Rt foot  and passed out while in BR . Wife helped PT to ground and wife Pt did not hit his head . Pt had surgery to Rt foot yesterday.

## 2015-07-01 ENCOUNTER — Encounter: Payer: Self-pay | Admitting: Cardiovascular Disease

## 2015-08-01 ENCOUNTER — Ambulatory Visit: Payer: BLUE CROSS/BLUE SHIELD | Admitting: Cardiovascular Disease

## 2015-08-03 ENCOUNTER — Ambulatory Visit (INDEPENDENT_AMBULATORY_CARE_PROVIDER_SITE_OTHER): Payer: BLUE CROSS/BLUE SHIELD | Admitting: Cardiovascular Disease

## 2015-08-03 ENCOUNTER — Encounter: Payer: Self-pay | Admitting: Cardiovascular Disease

## 2015-08-03 VITALS — BP 118/78 | HR 71 | Ht 66.0 in | Wt 158.8 lb

## 2015-08-03 DIAGNOSIS — I2510 Atherosclerotic heart disease of native coronary artery without angina pectoris: Secondary | ICD-10-CM | POA: Diagnosis not present

## 2015-08-03 DIAGNOSIS — I10 Essential (primary) hypertension: Secondary | ICD-10-CM | POA: Diagnosis not present

## 2015-08-03 DIAGNOSIS — E785 Hyperlipidemia, unspecified: Secondary | ICD-10-CM

## 2015-08-03 NOTE — Progress Notes (Signed)
Chief Complaint  Patient presents with  . Follow-up   History of Present Illness: 55 yo male with history of HTN, CAD with prior PCI, HLD, GERD who is here today for cardiac follow up. I am meeting him for the first time today. He has been followed by Dr. Mayford Knife in our office. He has had remote stenting of the LAD (DES)  in [place], [place] in 2008. He had right sided chest pain at the time. He has had several normal stress tests since then.   He has been doing well. No chest pain, SOB, palpitations. Recent right foot surgery. He has not been exercising due to this.   Primary Care Physician: Leanor Rubenstein, MD   Past Medical History:  Diagnosis Date  . Benign essential HTN 06/17/2014  . CAD (coronary artery disease), native coronary artery 06/17/2014   S/p PCI of LAD 2008 in [place].  Nuclear stress test 2013 with no ischemia and EF 64%  . Dyslipidemia, goal LDL below 70 06/17/2014  . GERD (gastroesophageal reflux disease)   . Hyperlipidemia   . Hypertension   . Shingles   . Vitamin D deficiency     Past Surgical History:  Procedure Laterality Date  . CARDIAC CATHETERIZATION    . CORONARY ANGIOPLASTY  2008   PCI of LAD [facility]  . FOOT SURGERY Right 06/07/2015  . TONSILLECTOMY    . VASECTOMY      Current Outpatient Prescriptions  Medication Sig Dispense Refill  . amLODipine (NORVASC) 2.5 MG tablet Take 2.5 mg by mouth daily.  0  . aspirin (BAYER ASPIRIN EC LOW DOSE) 81 MG EC tablet Take 81 mg by mouth daily. Swallow whole.    . Cholecalciferol (VITAMIN D-3) 1000 UNITS CAPS Take 1,000 Units by mouth daily.    . CRESTOR 20 MG tablet Take 20 mg by mouth daily.  0  . niacin (NIASPAN) 500 MG CR tablet Take 500 mg by mouth daily.  0  . PLAVIX 75 MG tablet Take 1 tablet (75 mg total) by mouth daily. 90 tablet 3   No current facility-administered medications for this visit.     No Known Allergies  Social History   Social History  . Marital status: Married    Spouse name:  elaine  . Number of children: 2  . 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 Smoking status: Never Smoker  . Smokeless tobacco: Not on file  . Alcohol use 0.0 oz/week     Comment: occasional  . Drug use: No  . Sexual activity: Not on file   Other Topics Concern  . Not on file   Social History Narrative  . No narrative on file    Family History  Problem Relation Age of Onset  . Hyperlipidemia Mother   . Dementia Mother   . Angina Father   . Heart disease Father     Review of Systems:  As stated in the HPI and otherwise negative.   BP 118/78 (BP Location: Right Arm, Patient Position: Sitting, Cuff Size: Normal)   Pulse 71   Ht  (1.676 m)   Wt 158 lb 12.8 oz (72 kg)   SpO2 97%   BMI 25.63 kg/m   Physical Examination: General: Well developed, well nourished, NAD  HEENT: OP clear, mucus membranes moist  SKIN: warm, dry. No rashes. Neuro: No focal deficits  Musculoskeletal: Muscle strength 5/5 all ext  Psychiatric: Mood and affect normal  Neck: No JVD, no carotid bruits, no thyromegaly, no lymphadenopathy.  Lungs:Clear bilaterally, no wheezes, rhonci, crackles Cardiovascular: Regular rate and rhythm. No murmurs, gallops or rubs. Abdomen:Soft. Bowel sounds present. Non-tender.  Extremities: No lower extremity edema. Pulses are 2 + in the bilateral DP/PT.  EKG:  EKG is not ordered today. The ekg ordered today demonstrates   Recent Labs: 06/08/2015: BUN 9; Creatinine, Ser 0.85; Hemoglobin 12.4; Platelets 246; Potassium 3.7; Sodium 138   Lipid Panel No results found for: CHOL, TRIG, HDL, CHOLHDL, VLDL, LDLCALC, LDLDIRECT   Wt Readings from Last 3 Encounters:  08/03/15 158 lb 12.8 oz (72 kg)  12/22/14 160 lb 8 oz (72.8 kg)  06/17/14 154 lb 12.8 oz (70.2 kg)     Other studies Reviewed: Additional studies/ records that were reviewed today include: . Review of the above records demonstrates:    Assessment and Plan:   1. CAD without angina: He is on ASA, Plavix, statin. He is having no chest pain suggestive of angina. No changes.   2. HTN: BP controlled. No changes.   3. HLD: He is on a statin. Lipids followed in primary care.   Current medicines are reviewed at length with the patient today.  The patient does not have concerns regarding medicines.  The following changes have been made:  no change  Labs/ tests ordered today include:  No orders of the defined types were placed in this encounter.   Disposition:   FU with me in 6 months  Signed, Verne Carrow, MD 08/03/2015 5:25 PM    [facility] [address] [place], [place], [place]  [address] Phone: [phone]; Fax: [phone]

## 2015-08-03 NOTE — Patient Instructions (Signed)

## 2015-09-30 ENCOUNTER — Encounter (HOSPITAL_COMMUNITY): Payer: Self-pay | Admitting: Emergency Medicine

## 2015-09-30 ENCOUNTER — Telehealth: Payer: Self-pay | Admitting: Cardiovascular Disease

## 2015-09-30 ENCOUNTER — Emergency Department (HOSPITAL_COMMUNITY): Payer: BLUE CROSS/BLUE SHIELD

## 2015-09-30 ENCOUNTER — Emergency Department (HOSPITAL_COMMUNITY)
Admission: EM | Admit: 2015-09-30 | Discharge: 2015-09-30 | Disposition: A | Discharge status: Home / Self Care | Payer: BLUE CROSS/BLUE SHIELD | Attending: Emergency Medicine | Admitting: Emergency Medicine

## 2015-09-30 DIAGNOSIS — R079 Chest pain, unspecified: Secondary | ICD-10-CM

## 2015-09-30 DIAGNOSIS — Z79899 Other long term (current) drug therapy: Secondary | ICD-10-CM | POA: Diagnosis not present

## 2015-09-30 DIAGNOSIS — R072 Precordial pain: Secondary | ICD-10-CM | POA: Insufficient documentation

## 2015-09-30 DIAGNOSIS — M7989 Other specified soft tissue disorders: Secondary | ICD-10-CM | POA: Diagnosis not present

## 2015-09-30 DIAGNOSIS — Z791 Long term (current) use of non-steroidal anti-inflammatories (NSAID): Secondary | ICD-10-CM | POA: Diagnosis not present

## 2015-09-30 DIAGNOSIS — R071 Chest pain on breathing: Secondary | ICD-10-CM | POA: Insufficient documentation

## 2015-09-30 DIAGNOSIS — I10 Essential (primary) hypertension: Secondary | ICD-10-CM | POA: Insufficient documentation

## 2015-09-30 DIAGNOSIS — Z7982 Long term (current) use of aspirin: Secondary | ICD-10-CM | POA: Insufficient documentation

## 2015-09-30 DIAGNOSIS — I2510 Atherosclerotic heart disease of native coronary artery without angina pectoris: Secondary | ICD-10-CM | POA: Insufficient documentation

## 2015-09-30 DIAGNOSIS — Z87891 Personal history of nicotine dependence: Secondary | ICD-10-CM | POA: Insufficient documentation

## 2015-09-30 LAB — I-STAT TROPONIN, ED: Troponin i, poc: 0 ng/mL (ref 0.00–0.08)

## 2015-09-30 LAB — BASIC METABOLIC PANEL
Anion gap: 7 (ref 5–15)
BUN: 18 mg/dL (ref 6–20)
CO2: 29 mmol/L (ref 22–32)
Calcium: 9.1 mg/dL (ref 8.9–10.3)
Chloride: 103 mmol/L (ref 101–111)
Creatinine, Ser: 0.9 mg/dL (ref 0.61–1.24)
GFR calc Af Amer: >60 mL/min (ref >60)
GFR calc non Af Amer: >60 mL/min (ref >60)
Glucose, Bld: 103 mg/dL — ABNORMAL HIGH (ref 65–99)
Potassium: 4.1 mmol/L (ref 3.5–5.1)
Sodium: 139 mmol/L (ref 135–145)

## 2015-09-30 LAB — CBC
HCT: 38.2 % — ABNORMAL LOW (ref 39.0–52.0)
Hemoglobin: 12.3 g/dL — ABNORMAL LOW (ref 13.0–17.0)
MCH: 28.1 pg (ref 26.0–34.0)
MCHC: 32.2 g/dL (ref 30.0–36.0)
MCV: 87.4 fL (ref 78.0–100.0)
Platelets: 324 10*3/uL (ref 150–400)
RBC: 4.37 MIL/uL (ref 4.22–5.81)
RDW: 13 % (ref 11.5–15.5)
WBC: 7.1 10*3/uL (ref 4.0–10.5)

## 2015-09-30 LAB — D-DIMER, QUANTITATIVE: D-Dimer, Quant: <0.27 ug/mL-FEU (ref 0.00–0.50)

## 2015-09-30 MED ORDER — CYCLOBENZAPRINE HCL 10 MG PO TABS: 10.0000 mg | ORAL_TABLET | Freq: Two times a day (BID) | ORAL | 0 refills | Status: DC | PRN

## 2015-09-30 NOTE — ED Provider Notes (Signed)
[facility]-EMERGENCY DEPT Provider Note   CSN: [id number] Arrival date & time: 09/30/15  1543     History   Chief Complaint Chief Complaint  Patient presents with  . Chest Pain    HPI Pierce CraneWilliam Vanevery is a 55 y.o. male.  HPI Patient presents with pain in his left chest. It is sharp. Worse with movements. Worse with deep breaths. He had a previous stent on the LAD around 11 years ago. States this does not feel like that. No shortness of breath. Did have foot surgery several months ago and states he uses a cane. States he feels as if he pulled a muscle on that side. Does have some swelling in his right leg but states this is chronic. No diaphoresis. No nausea.   Past Medical History:  Diagnosis Date  . Benign essential HTN 06/17/2014  . CAD (coronary artery disease), native coronary artery 06/17/2014   S/p PCI of LAD 2008 in [place].  Nuclear stress test 2013 with no ischemia and EF 64%  . Dyslipidemia, goal LDL below 70 06/17/2014  . GERD (gastroesophageal reflux disease)   . Hyperlipidemia   . Hypertension   . Shingles   . Vitamin D deficiency     Patient Active Problem List   Diagnosis Date Noted  . GERD (gastroesophageal reflux disease)   . Vitamin D deficiency   . Shingles   . CAD (coronary artery disease), native coronary artery 06/17/2014  . Dyslipidemia, goal LDL below 70 06/17/2014  . Benign essential HTN 06/17/2014    Past Surgical History:  Procedure Laterality Date  . CARDIAC CATHETERIZATION    . [facility]ON[facility]  2008   [facility]  . FOOT SURGERY Right 06/07/2015  . TONSILLECTOMY    . VASECTOMY         Home Medications    Prior to Admission medications   Medication Sig Start Date End Date Taking? Authorizing Provider  acetaminophen (TYLENOL) 325 MG tablet Take 650 mg by mouth every 6 (six) hours as needed for moderate pain.   Yes Historical Provider, MD  amLODipine (NORVASC) 2.5 MG tablet Take 2.5 mg by mouth daily. 04/28/14  Yes  Historical Provider, MD  aspirin (BAYER ASPIRIN EC LOW DOSE) 81 MG EC tablet Take 81 mg by mouth at bedtime. Swallow whole.    Yes Historical Provider, MD  Cholecalciferol (VITAMIN D-3) 1000 UNITS CAPS Take 1,000 Units by mouth every evening.    Yes Historical Provider, MD  CRESTOR 20 MG tablet Take 20 mg by mouth daily. 05/19/14  Yes Historical Provider, MD  PLAVIX 75 MG tablet Take 1 tablet (75 mg total) by mouth daily. 12/22/14  Yes Quintella Reichertraci R Turner, MD  cyclobenzaprine (FLEXERIL) 10 MG tablet Take 1 tablet (10 mg total) by mouth 2 (two) times daily as needed for muscle spasms. 09/30/15   Benjiman CoreNathan Ellora Varnum, MD    Family History Family History  Problem Relation Age of Onset  . Hyperlipidemia Mother   . Dementia Mother   . Angina Father   . Heart disease Father     Social History Social History  Substance Use Topics  . Smoking status: Never Smoker  . Smokeless tobacco: Former NeurosurgeonUser  . Alcohol use 0.0 oz/week     Comment: occasional     Allergies   Review of patient's allergies indicates no known allergies.   Review of Systems Review of Systems  Constitutional: Negative for appetite change.  HENT: Negative for congestion.   Respiratory: Negative for  cough, chest tightness and shortness of breath.   Cardiovascular: Positive for chest pain and leg swelling.  Gastrointestinal: Negative for abdominal pain.  Genitourinary: Negative for flank pain.  Musculoskeletal: Negative for gait problem.  Neurological: Negative for weakness.  Hematological: Negative for adenopathy.  Psychiatric/Behavioral: Negative for confusion.     Physical Exam Updated Vital Signs BP 146/93   Pulse (!) 58   Temp 98.2 F (36.8 C) (Oral)   Resp 22   Ht 5\' 6"  (1.676 m)   Wt 156 lb (70.8 kg)   SpO2 100%   BMI 25.18 kg/m   Physical Exam  Constitutional: He appears well-developed.  HENT:  Head: Atraumatic.  Eyes: EOM are normal.  Neck: Neck supple.  Cardiovascular: Normal rate.     Pulmonary/Chest: Effort normal. He exhibits tenderness.  Some tenderness to left anterior chest wall near the sternum. No crepitance or deformity. Pain is worse with pressing his palms together.  Abdominal: There is no tenderness.  Musculoskeletal: He exhibits no edema.  Neurological: He is alert.  Skin: Skin is warm. Capillary refill takes less than 2 seconds.  Psychiatric: He has a normal mood and affect.     ED Treatments / Results  Labs (all labs ordered are listed, but only abnormal results are displayed) Labs Reviewed  BASIC METABOLIC PANEL - Abnormal; Notable for the following:       Result Value   Glucose, Bld 103 (*)    All other components within normal limits  CBC - Abnormal; Notable for the following:    Hemoglobin 12.3 (*)    HCT 38.2 (*)    All other components within normal limits  D-DIMER, QUANTITATIVE (NOT AT [facility])  I-STAT TROPOININ, ED    EKG  EKG Interpretation  Date/Time:  Friday September 30 2015 15:48:20 EDT Ventricular Rate:  62 PR Interval:    QRS Duration: 91 QT Interval:  376 QTC Calculation: 382 R Axis:   5 Text Interpretation:  Sinus rhythm Borderline low voltage, extremity leads Confirmed by Rubin Payor  MD, Mateen Franssen [phone]) on 09/30/2015 6:57:30 PM       Radiology Dg Chest 2 View  Result Date: 09/30/2015 CLINICAL DATA:  Left-sided chest pain. Occasional shortness of breath. EXAM: CHEST  2 VIEW COMPARISON:  None. FINDINGS: Midline trachea.  Normal heart size and mediastinal contours. Sharp costophrenic angles.  No pneumothorax.  Clear lungs. IMPRESSION: No active cardiopulmonary disease. Electronically Signed   By: Jeronimo Greaves M.D.   On: 09/30/2015 16:18    Procedures Procedures (including critical care time)  Medications Ordered in ED Medications - No data to display   Initial Impression / Assessment and Plan / ED Course  I have reviewed the triage vital signs and the nursing notes.  Pertinent labs & imaging results that were  available during my care of the patient were reviewed by me and considered in my medical decision making (see chart for details).  Clinical Course    Patient with chest pain. Left-sided and pleuritic. Tender to palpation. May be musculoskeletal. Troponin negative. EKG reassuring. Pain syndrome going for 3 days. Negative d-dimer. Will discharge home.  Final Clinical Impressions(s) / ED Diagnoses   Final diagnoses:  Chest pain, unspecified chest pain type    New Prescriptions New Prescriptions   CYCLOBENZAPRINE (FLEXERIL) 10 MG TABLET    Take 1 tablet (10 mg total) by mouth 2 (two) times daily as needed for muscle spasms.     Benjiman Core, MD 09/30/15 2007

## 2015-09-30 NOTE — ED Triage Notes (Signed)
Pt from home with complaints of sharp pain in  His upper left chest that has been present about 2 days. Pt states he had a stint placed in 2015. Pt states the pain is worse when he bends over or takes a deep breath. Pt denies lightheadedness, dizziness, or radiation of pain. Pt has strong bilateral radial pulses and clear lung sounds.

## 2015-09-30 NOTE — ED Notes (Signed)
Patient having pain only when he takes a deep breath.  Denies, dizziness, nausea, or shortness of breath.

## 2015-09-30 NOTE — Telephone Encounter (Signed)
Calling stating he started having pain around his heart earlier today.  Denies SOB, sweating, and nausea.  States when he takes a deep breath has the pain.  States has been using a cane until Tues due to recent foot surgery and may have been pressing too hard on it.  States he just left the office and is on the way here.  Advised that we do not accept walk-ins and do not have any one that can see him with CP at 3:30 in afternoon.  He will need to go to ER.  Advised will speak w/Dr. Clifton JamesMcAlhany.  Spoke w/Dr. Clifton JamesMcAlhany who advises for him to go to ER.  Advised that the ER can better evaluate the CP.   Advised him to go but not sure from his tone that he will go.

## 2015-10-03 ENCOUNTER — Ambulatory Visit: Payer: BLUE CROSS/BLUE SHIELD | Admitting: Cardiovascular Disease

## 2015-10-03 NOTE — Progress Notes (Deleted)
No chief complaint on file.  History of Present Illness: 55 yo male with history of HTN, CAD with prior PCI, HLD, GERD who is here today for cardiac follow up. I met him in July 2017. He had been followed by Dr. Radford Pax in our office. He has had remote stenting of the LAD (DES)  in [place], [place] in 2008. He had right sided chest pain at the time. He has had several normal stress tests since then.   He is here today for follow up. He was seen in the ED last week with c/o left sided chest pain. This was not felt to cardiac.   Primary Care Physician: Lynne Logan, MD   Past Medical History:  Diagnosis Date  . Benign essential HTN 06/17/2014  . CAD (coronary artery disease), native coronary artery 06/17/2014   S/p PCI of LAD 2008 in [place].  Nuclear stress test 2013 with no ischemia and EF 64%  . Dyslipidemia, goal LDL below 70 06/17/2014  . GERD (gastroesophageal reflux disease)   . Hyperlipidemia   . Hypertension   . Shingles   . Vitamin D deficiency     Past Surgical History:  Procedure Laterality Date  . CARDIAC CATHETERIZATION    . CORONARY ANGIOPLASTY  2008   PCI of LAD [facility]  . FOOT SURGERY Right 06/07/2015  . TONSILLECTOMY    . VASECTOMY      Current Outpatient Prescriptions  Medication Sig Dispense Refill  . acetaminophen (TYLENOL) 325 MG tablet Take 650 mg by mouth every 6 (six) hours as needed for moderate pain.    Marland Kitchen amLODipine (NORVASC) 2.5 MG tablet Take 2.5 mg by mouth daily.  0  . aspirin (BAYER ASPIRIN EC LOW DOSE) 81 MG EC tablet Take 81 mg by mouth at bedtime. Swallow whole.     . Cholecalciferol (VITAMIN D-3) 1000 UNITS CAPS Take 1,000 Units by mouth every evening.     Marland Kitchen CRESTOR 20 MG tablet Take 20 mg by mouth daily.  0  . cyclobenzaprine (FLEXERIL) 10 MG tablet Take 1 tablet (10 mg total) by mouth 2 (two) times daily as needed for muscle spasms. 10 tablet 0  . PLAVIX 75 MG tablet Take 1 tablet (75 mg total) by mouth daily. 90 tablet 3   No current  facility-administered medications for this visit.     No Known Allergies  Social History   Social History  . Marital status: Married    Spouse name: elaine  . Number of children: 2  . 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 Smoking status: Never Smoker  . Smokeless tobacco: Former Systems developer  . Alcohol use 0.0 oz/week     Comment: occasional  . Drug use: No  . Sexual activity: Not on file   Other Topics Concern  . Not on file   Social History Narrative  . No narrative on file    Family History  Problem Relation Age of Onset  . Hyperlipidemia Mother   . Dementia Mother   . Angina Father   . Heart disease Father     Review of Systems:  As stated in the HPI and otherwise negative.   There were no vitals taken for this visit.  Physical Examination: General: Well developed, well nourished, NAD  HEENT: OP clear, mucus membranes moist  SKIN: warm, dry. No rashes. Neuro: No focal deficits  Musculoskeletal: Muscle strength 5/5 all ext  Psychiatric: Mood and affect normal  Neck: No JVD, no carotid bruits, no thyromegaly, no lymphadenopathy.  Lungs:Clear bilaterally, no wheezes, rhonci, crackles Cardiovascular: Regular rate and rhythm. No murmurs, gallops or rubs. Abdomen:Soft. Bowel sounds present. Non-tender.  Extremities: No lower extremity edema. Pulses are 2 + in the bilateral DP/PT.  EKG:  EKG is not ordered today. The ekg ordered today demonstrates   Recent Labs: 09/30/2015: BUN 18; Creatinine, Ser 0.90; Hemoglobin 12.3; Platelets 324; Potassium 4.1; Sodium 139   Lipid Panel No results found for: CHOL, TRIG, HDL, CHOLHDL, VLDL, LDLCALC, LDLDIRECT   Wt Readings from Last 3 Encounters:  09/30/15 70.8 kg (156 lb)  08/03/15 72 kg (158 lb 12.8 oz)  12/22/14 72.8 kg (160 lb 8 oz)     Other studies Reviewed: Additional studies/ records that were reviewed today include: . Review of the above records  demonstrates:   Assessment and Plan:   1. CAD without angina: He is on ASA, Plavix, statin. No changes.   2. HTN: BP controlled. No changes.   3. HLD: He is on a statin. Lipids followed in primary care.   Current medicines are reviewed at length with the patient today.  The patient does not have concerns regarding medicines.  The following changes have been made:  no change  Labs/ tests ordered today include:  No orders of the defined types were placed in this encounter.   Disposition:   FU with me in 6 months  Signed, Lauree Chandler, MD 10/03/2015 7:46 AM    [facility] [place], [place], [place]  [address] Phone: [phone]; Fax: [phone]

## 2015-10-11 ENCOUNTER — Ambulatory Visit (INDEPENDENT_AMBULATORY_CARE_PROVIDER_SITE_OTHER): Payer: Self-pay | Admitting: Orthopedic Surgery

## 2015-10-24 ENCOUNTER — Ambulatory Visit (INDEPENDENT_AMBULATORY_CARE_PROVIDER_SITE_OTHER): Payer: Self-pay | Admitting: Orthopedic Surgery

## 2015-10-27 ENCOUNTER — Ambulatory Visit (INDEPENDENT_AMBULATORY_CARE_PROVIDER_SITE_OTHER): Payer: BLUE CROSS/BLUE SHIELD | Admitting: Orthopedic Surgery

## 2015-10-27 DIAGNOSIS — M19171 Post-traumatic osteoarthritis, right ankle and foot: Secondary | ICD-10-CM

## 2015-10-27 DIAGNOSIS — M19071 Primary osteoarthritis, right ankle and foot: Secondary | ICD-10-CM

## 2015-10-27 DIAGNOSIS — M25571 Pain in right ankle and joints of right foot: Secondary | ICD-10-CM

## 2016-02-02 ENCOUNTER — Ambulatory Visit: Payer: BLUE CROSS/BLUE SHIELD | Admitting: Cardiovascular Disease

## 2016-02-06 ENCOUNTER — Ambulatory Visit (INDEPENDENT_AMBULATORY_CARE_PROVIDER_SITE_OTHER): Payer: BLUE CROSS/BLUE SHIELD | Admitting: Cardiovascular Disease

## 2016-02-06 VITALS — BP 124/70 | HR 56 | Ht 66.0 in | Wt 160.6 lb

## 2016-02-06 DIAGNOSIS — I2510 Atherosclerotic heart disease of native coronary artery without angina pectoris: Secondary | ICD-10-CM

## 2016-02-06 DIAGNOSIS — I10 Essential (primary) hypertension: Secondary | ICD-10-CM

## 2016-02-06 DIAGNOSIS — E785 Hyperlipidemia, unspecified: Secondary | ICD-10-CM | POA: Diagnosis not present

## 2016-02-06 NOTE — Patient Instructions (Signed)

## 2016-02-06 NOTE — Progress Notes (Signed)
Chief Complaint  Patient presents with  . Follow-up    6 MONTHS   History of Present Illness: 56 yo male with history of HTN, CAD with prior PCI, HLD, GERD who is here today for cardiac follow up. I met him in July 2017. He had been previously followed by Dr. Radford Pax in our office. He has had remote stenting of the LAD (DES)  in [place], [place] in 2008. He had right sided chest pain at the time. He has had several normal stress tests since then. Seen in th ED September 2017 with atypical left sided chest pain felt to be musculoskeletal. Troponin negative.   He has been doing well. No chest pain, SOB, palpitations. His foot has healed well post surgery. He is back to exercising 4 times per week.   Primary Care Physician: Lynne Logan, MD   Past Medical History:  Diagnosis Date  . Benign essential HTN 06/17/2014  . CAD (coronary artery disease), native coronary artery 06/17/2014   S/p PCI of LAD 2008 in [place].  Nuclear stress test 2013 with no ischemia and EF 64%  . Dyslipidemia, goal LDL below 70 06/17/2014  . GERD (gastroesophageal reflux disease)   . Hyperlipidemia   . Hypertension   . Shingles   . Vitamin D deficiency     Past Surgical History:  Procedure Laterality Date  . CARDIAC CATHETERIZATION    . CORONARY ANGIOPLASTY  2008   PCI of LAD [facility]  . FOOT SURGERY Right 06/07/2015  . TONSILLECTOMY    . VASECTOMY      Current Outpatient Prescriptions  Medication Sig Dispense Refill  . acetaminophen (TYLENOL) 325 MG tablet Take 650 mg by mouth every 6 (six) hours as needed for moderate pain.    Marland Kitchen amLODipine (NORVASC) 2.5 MG tablet Take 2.5 mg by mouth daily.  0  . aspirin (BAYER ASPIRIN EC LOW DOSE) 81 MG EC tablet Take 81 mg by mouth at bedtime. Swallow whole.     . Cholecalciferol (VITAMIN D-3) 1000 UNITS CAPS Take 1,000 Units by mouth every evening.     Marland Kitchen CRESTOR 20 MG tablet Take 20 mg by mouth daily.  0  . PLAVIX 75 MG tablet Take 1 tablet (75 mg total) by mouth  daily. 90 tablet 3   No current facility-administered medications for this visit.     No Known Allergies  Social History   Social History  . Marital status: Married    Spouse name: elaine  . Number of children: 2  . 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 Smoking status: Never Smoker  . Smokeless tobacco: Former Systems developer  . Alcohol use 0.0 oz/week     Comment: occasional  . Drug use: No  . Sexual activity: Not on file   Other Topics Concern  . Not on file   Social History Narrative  . No narrative on file    Family History  Problem Relation Age of Onset  . Hyperlipidemia Mother   . Dementia Mother   . Angina Father   . Heart disease Father     Review of Systems:  As stated in the HPI and otherwise negative.   BP 124/70 (BP Location: Right Arm)   Pulse (!) 56   Ht '5\' 6"'$  (1.676 m)   Wt 160 lb 9.6 oz (72.8 kg)   BMI 25.92 kg/m   Physical Examination: General: Well developed, well  nourished, NAD  HEENT: OP clear, mucus membranes moist  SKIN: warm, dry. No rashes. Neuro: No focal deficits  Musculoskeletal: Muscle strength 5/5 all ext  Psychiatric: Mood and affect normal  Neck: No JVD, no carotid bruits, no thyromegaly, no lymphadenopathy.  Lungs:Clear bilaterally, no wheezes, rhonci, crackles Cardiovascular: Regular rate and rhythm. No murmurs, gallops or rubs. Abdomen:Soft. Bowel sounds present. Non-tender.  Extremities: No lower extremity edema. Pulses are 2 + in the bilateral DP/PT.  EKG:  EKG is not ordered today. The ekg ordered today demonstrates   Recent Labs: 09/30/2015: BUN 18; Creatinine, Ser 0.90; Hemoglobin 12.3; Platelets 324; Potassium 4.1; Sodium 139   Lipid Panel No results found for: CHOL, TRIG, HDL, CHOLHDL, VLDL, LDLCALC, LDLDIRECT   Wt Readings from Last 3 Encounters:  02/06/16 160 lb 9.6 oz (72.8 kg)  09/30/15 156 lb (70.8 kg)  08/03/15 158 lb 12.8 oz (72 kg)      Other studies Reviewed: Additional studies/ records that were reviewed today include: . Review of the above records demonstrates:   Assessment and Plan:   1. CAD without angina: He is having no chest pain suggestive of angina. He is on ASA, Plavix, statin. No changes.   2. HTN: BP controlled. No changes.   3. HLD: He is on a statin. Lipids followed in primary care.   Current medicines are reviewed at length with the patient today.  The patient does not have concerns regarding medicines.  The following changes have been made:  no change  Labs/ tests ordered today include:  No orders of the defined types were placed in this encounter.   Disposition:   FU with me in 6 months  Signed, Lauree Chandler, MD 02/06/2016 11:36 AM    [facility] [place], [place], [place]  [address] Phone: [phone]; Fax: [phone]

## 2016-08-17 ENCOUNTER — Ambulatory Visit (INDEPENDENT_AMBULATORY_CARE_PROVIDER_SITE_OTHER): Payer: BLUE CROSS/BLUE SHIELD | Admitting: Cardiovascular Disease

## 2016-08-17 ENCOUNTER — Encounter: Payer: Self-pay | Admitting: Cardiovascular Disease

## 2016-08-17 VITALS — BP 110/80 | HR 67 | Ht 66.0 in | Wt 158.8 lb

## 2016-08-17 DIAGNOSIS — I2510 Atherosclerotic heart disease of native coronary artery without angina pectoris: Secondary | ICD-10-CM | POA: Diagnosis not present

## 2016-08-17 DIAGNOSIS — I10 Essential (primary) hypertension: Secondary | ICD-10-CM

## 2016-08-17 DIAGNOSIS — E785 Hyperlipidemia, unspecified: Secondary | ICD-10-CM

## 2016-08-17 MED ORDER — ROSUVASTATIN CALCIUM 20 MG PO TABS: 20.0000 mg | ORAL_TABLET | Freq: Every day | ORAL | 3 refills | Status: DC

## 2016-08-17 MED ORDER — AMLODIPINE BESYLATE 5 MG PO TABS: 5.0000 mg | ORAL_TABLET | Freq: Every day | ORAL | 3 refills | Status: DC

## 2016-08-17 NOTE — Patient Instructions (Signed)
Medication Instructions:  Your physician has recommended you make the following change in your medication:  Increase Norvasc to 5 mg by mouth daily   Labwork: none  Testing/Procedures: Your physician has requested that you have an exercise tolerance test. For further information please visit [email or web address]. Please also follow instruction sheet, as given.  Your physician has requested that you have an echocardiogram. Echocardiography is a painless test that uses sound waves to create images of your heart. It provides your doctor with information about the size and shape of your heart and how well your heart's chambers and valves are working. This procedure takes approximately one hour. There are no restrictions for this procedure.    Follow-Up: Your physician recommends that you schedule a follow-up appointment in: 6 months. Please call our office in about 3 months to schedule this appointment.     Any Other Special Instructions Will Be Listed Below (If Applicable).     If you need a refill on your cardiac medications before your next appointment, please call your pharmacy.

## 2016-08-17 NOTE — Progress Notes (Signed)
Chief Complaint  Patient presents with  . Follow-up    CAD   History of Present Illness: 56 yo male with history of HTN, CAD with prior PCI, HLD, GERD who is here today for cardiac follow up. I met him in July 2017. He had been previously followed by Dr. Radford Pax in our office. He has had remote stenting of the LAD with a drug eluting stent in 2008 in [place], [place]. He had right sided chest pain at the time. He has had several normal stress tests since then. Seen in th ED September 2017 with atypical left sided chest pain felt to be musculoskeletal. Troponin negative.   He is here today for follow up. The patient denies any chest pain, dyspnea, palpitations, lower extremity edema, orthopnea, PND, dizziness, near syncope or syncope. He is exercising 2-3 days per week. No exertional chest pain. He has headaches.   Primary Care Physician: Donald Prose, MD   Past Medical History:  Diagnosis Date  . Benign essential HTN 06/17/2014  . CAD (coronary artery disease), native coronary artery 06/17/2014   S/p PCI of LAD 2008 in [place].  Nuclear stress test 2013 with no ischemia and EF 64%  . Dyslipidemia, goal LDL below 70 06/17/2014  . GERD (gastroesophageal reflux disease)   . Hyperlipidemia   . Hypertension   . Shingles   . Vitamin D deficiency     Past Surgical History:  Procedure Laterality Date  . CARDIAC CATHETERIZATION    . CORONARY ANGIOPLASTY  2008   PCI [facility] LAD [facility]  . FOOT SURGERY Right 06/07/2015  . TONSILLECTOMY    . VASECTOMY      Current Outpatient Prescriptions  Medication Sig Dispense Refill  . acetaminophen (TYLENOL) 325 MG tablet Take 650 mg by mouth every 6 (six) hours as needed for moderate pain.    Marland Kitchen amLODipine (NORVASC) 2.5 MG tablet Take 2.5 mg by mouth daily.  0  . aspirin (BAYER ASPIRIN EC LOW DOSE) 81 MG EC tablet Take 81 mg by mouth at bedtime. Swallow whole.     . Cholecalciferol (VITAMIN D-3) 1000 UNITS CAPS Take 1,000 Units by mouth every evening.       Marland Kitchen CRESTOR 20 MG tablet Take 20 mg by mouth daily.  0  . PLAVIX 75 MG tablet Take 1 tablet (75 mg total) by mouth daily. 90 tablet 3   No current facility-administered medications for this visit.     No Known Allergies  Social History   Social History  . Marital status: Married    Spouse name: elaine  . Number of children: 2  . 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 Smoking status: Never Smoker  . Smokeless tobacco: Former Systems developer  . Alcohol use 0.0 oz/week     Comment: occasional  . Drug use: No  . Sexual activity: Not on file   Other Topics Concern  . Not on file   Social History Narrative  . No narrative on file    Family History  Problem Relation Age of Onset  . Hyperlipidemia Mother   . Dementia Mother   . Angina Father   . Heart disease Father     Review of Systems:  As stated in the HPI and otherwise negative.   BP 110/80   Pulse 67   Ht '5\' 6"'$  (1.676 m)   Wt 158 lb 12.8 oz (72 kg)  SpO2 97%   BMI 25.63 kg/m   Physical Examination:  General: Well developed, well nourished, NAD  HEENT: OP clear, mucus membranes moist  SKIN: warm, dry. No rashes. Neuro: No focal deficits  Musculoskeletal: Muscle strength 5/5 all ext  Psychiatric: Mood and affect normal  Neck: No JVD, no carotid bruits, no thyromegaly, no lymphadenopathy.  Lungs:Clear bilaterally, no wheezes, rhonci, crackles Cardiovascular: Regular rate and rhythm. No murmurs, gallops or rubs. Abdomen:Soft. Bowel sounds present. Non-tender.  Extremities: No lower extremity edema. Pulses are 2 + in the bilateral DP/PT.  EKG:  EKG is  ordered today. The ekg ordered today demonstrates NSR, rate 64 bpm.   Recent Labs: 09/30/2015: BUN 18; Creatinine, Ser 0.90; Hemoglobin 12.3; Platelets 324; Potassium 4.1; Sodium 139   Lipid Panel No results found for: CHOL, TRIG, HDL, CHOLHDL, VLDL, LDLCALC, LDLDIRECT   Wt Readings from Last 3  Encounters:  08/17/16 158 lb 12.8 oz (72 kg)  02/06/16 160 lb 9.6 oz (72.8 kg)  09/30/15 156 lb (70.8 kg)     Other studies Reviewed: Additional studies/ records that were reviewed today include: . Review of the above records demonstrates:   Assessment and Plan:   1. CAD without angina: No chest pain suggestive of angina. Continue ASA, Plavix, statin. I will arrange an echo to assess LV function. I will arrange an exercise stress test since he has had no recent ischemic testing.   2. HTN: BP controlled today but has been elevated at home. Will increase Norvasc to 5 mg daily. No changes.    3. HLD: Lipids followed in primary care. LDL at goal. Continue statin.   Current medicines are reviewed at length with the patient today.  The patient does not have concerns regarding medicines.  The following changes have been made:  no change  Labs/ tests ordered today include:  No orders of the defined types were placed in this encounter.   Disposition:   FU with me in 6 months  Signed, Lauree Chandler, MD 08/17/2016 2:58 PM    [facility] [address], [place], [place]  [address] Phone: [phone]; Fax: [phone]

## 2016-09-04 ENCOUNTER — Ambulatory Visit (HOSPITAL_COMMUNITY): Payer: BLUE CROSS/BLUE SHIELD | Attending: Cardiology

## 2016-09-04 ENCOUNTER — Ambulatory Visit (INDEPENDENT_AMBULATORY_CARE_PROVIDER_SITE_OTHER): Payer: BLUE CROSS/BLUE SHIELD

## 2016-09-04 ENCOUNTER — Other Ambulatory Visit: Payer: Self-pay

## 2016-09-04 DIAGNOSIS — E785 Hyperlipidemia, unspecified: Secondary | ICD-10-CM | POA: Insufficient documentation

## 2016-09-04 DIAGNOSIS — I272 Pulmonary hypertension, unspecified: Secondary | ICD-10-CM | POA: Insufficient documentation

## 2016-09-04 DIAGNOSIS — I340 Nonrheumatic mitral (valve) insufficiency: Secondary | ICD-10-CM | POA: Diagnosis not present

## 2016-09-04 DIAGNOSIS — I2510 Atherosclerotic heart disease of native coronary artery without angina pectoris: Secondary | ICD-10-CM | POA: Diagnosis present

## 2016-09-04 LAB — EXERCISE TOLERANCE TEST
Estimated workload: 14.3 METS
Exercise duration (min): 13 min
Exercise duration (sec): 18 s
MPHR: 163 {beats}/min
Peak BP: 184 mmHg
Peak HR: 131 {beats}/min
Percent HR: 80 %
RPE: 17
Rest HR: 48 {beats}/min

## 2016-09-05 ENCOUNTER — Telehealth: Payer: Self-pay | Admitting: *Deleted

## 2016-09-05 NOTE — Telephone Encounter (Signed)
I spoke with pt and reviewed echo results with him.  He admits to being very nervous about his heart condition and is concerned about minor valve disease.  He is asking if Dr. Clifton James could call him to discuss results.

## 2016-09-11 NOTE — Telephone Encounter (Signed)
I spoke to him today and reviewed his echo. cdm

## 2017-05-18 IMAGING — CR DG CHEST 2V
2 series · 2 of 2 positions shown · non-contrast
Comparison: None.

CLINICAL DATA: Left-sided chest pain. Occasional shortness of
breath.

EXAM:
CHEST  2 VIEW

[w chest pa]
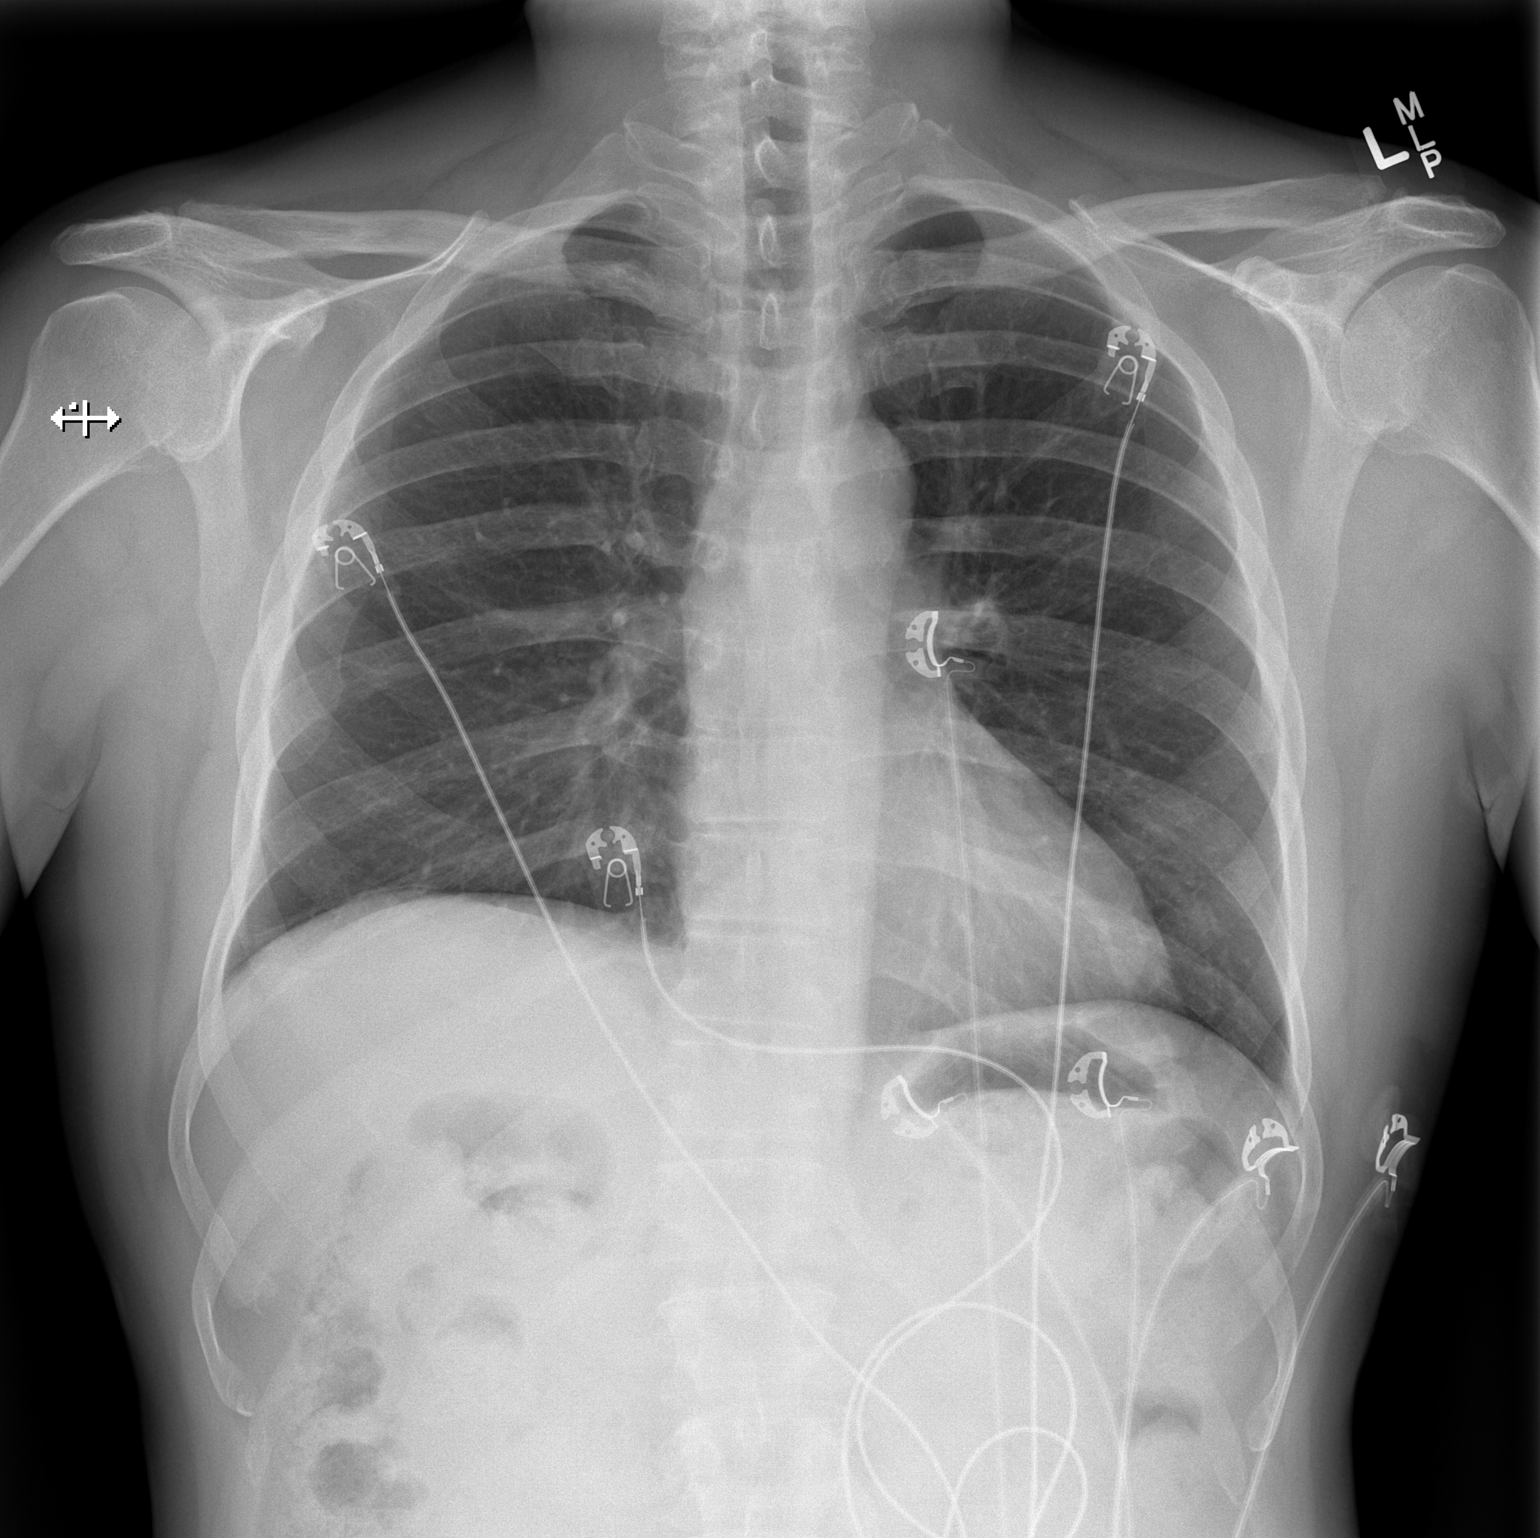

[w chest lat]
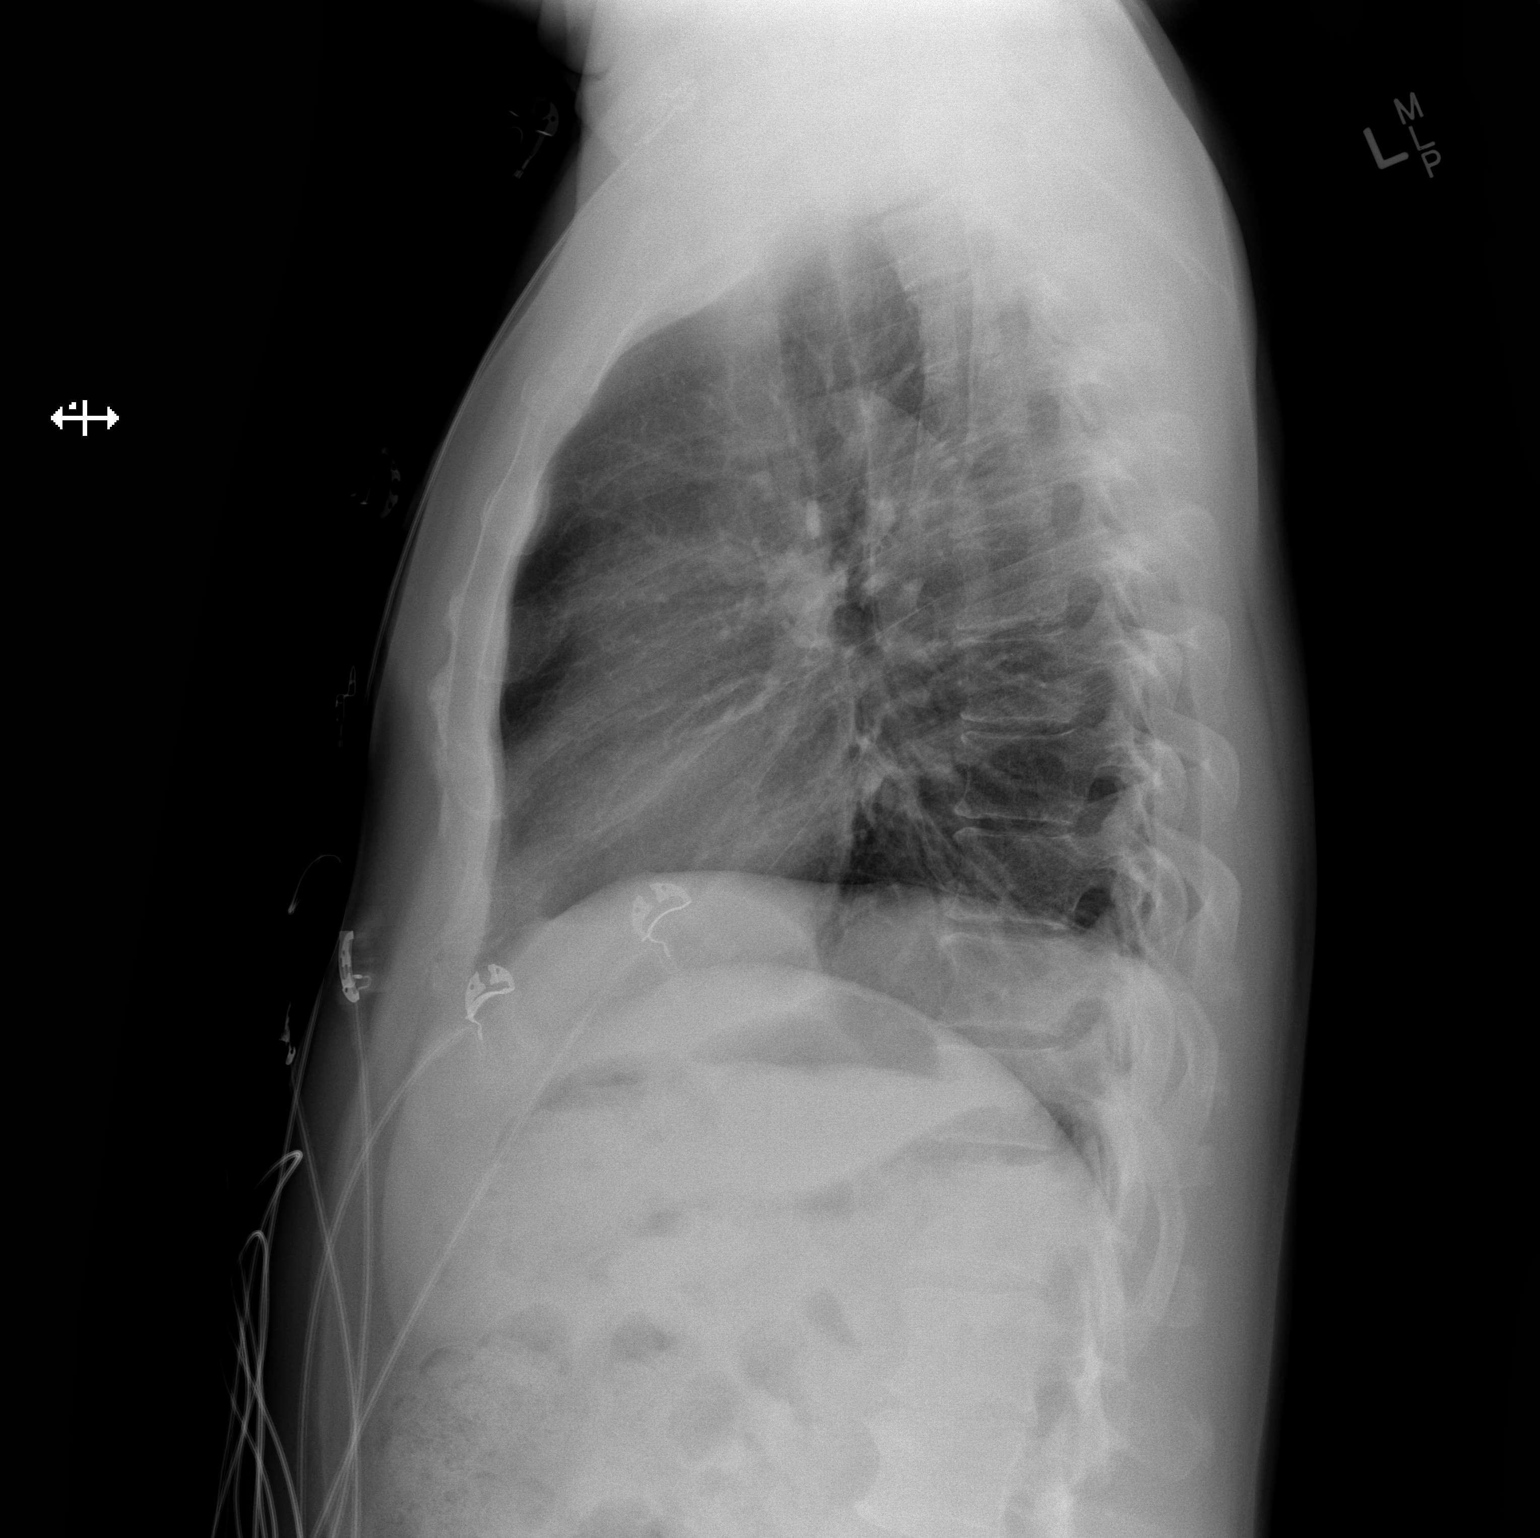

[2 of 2 positions shown; findings below may reference images not displayed]

FINDINGS: Midline trachea.  Normal heart size and mediastinal contours.

Sharp costophrenic angles.  No pneumothorax.  Clear lungs.
IMPRESSION: No active cardiopulmonary disease.

## 2017-06-26 ENCOUNTER — Other Ambulatory Visit: Payer: Self-pay | Admitting: Cardiovascular Disease

## 2017-08-29 ENCOUNTER — Encounter (INDEPENDENT_AMBULATORY_CARE_PROVIDER_SITE_OTHER): Payer: Self-pay | Admitting: Orthopedic Surgery

## 2017-08-29 ENCOUNTER — Ambulatory Visit (INDEPENDENT_AMBULATORY_CARE_PROVIDER_SITE_OTHER): Payer: BLUE CROSS/BLUE SHIELD | Admitting: Orthopedic Surgery

## 2017-08-29 ENCOUNTER — Ambulatory Visit (INDEPENDENT_AMBULATORY_CARE_PROVIDER_SITE_OTHER): Payer: Self-pay

## 2017-08-29 VITALS — Ht 66.0 in | Wt 158.0 lb

## 2017-08-29 DIAGNOSIS — M25571 Pain in right ankle and joints of right foot: Secondary | ICD-10-CM | POA: Diagnosis not present

## 2017-08-29 DIAGNOSIS — M25871 Other specified joint disorders, right ankle and foot: Secondary | ICD-10-CM | POA: Diagnosis not present

## 2017-08-29 NOTE — Progress Notes (Signed)
Office Visit Note   Patient: Luis Zamora           Date of Birth: 06/23/1960           MRN: [id number] Visit Date: 08/29/2017              Requested by: [name], [name], MD [phone] [address] [place], [place] [address] PCP: [name], [name], MD  Chief Complaint  Patient presents with  . Right Foot - Pain      HPI: Patient is a 57 year old gentleman who is status post subtalar fusion.  Patient states he has been having pain recently over the anterior lateral right ankle.  He has used Motrin without relief he states it hurts at times when he is working out denies any swelling.  Assessment & Plan: Visit Diagnoses:  1. Pain in right ankle and joints of right foot   2. Impingement of right ankle joint     Plan: Patient has impingement symptoms of the ankle.  Recommend proceeding with a steroid injection.  Patient states she would like to wait until next week he states he has a lot of traveling to do over the next week.  We will follow-up for evaluation for injection right ankle at follow-up.  Follow-Up Instructions: Return in about 1 week (around 09/05/2017).   Ortho Exam  Patient is alert, oriented, no adenopathy, well-dressed, normal affect, normal respiratory effort. Examination patient has good pulses he has good range of motion of the ankle he has no subtalar motion.  There is no tenderness to palpation proximally over the tibia or fibula.  The peroneal tendons are nontender to palpation.  Patient is exquisitely tender to palpation over the joint line worse over the anterior lateral joint line of the right ankle.  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  No images are attached to the encounter.  Labs: No results found for: HGBA1C, ESRSEDRATE, CRP, LABURIC, REPTSTATUS, GRAMSTAIN, CULT, LABORGA   No results found for:  ALBUMIN, PREALBUMIN, LABURIC  Body mass index is 25.5 kg/m.  Orders:  Orders Placed This Encounter  Procedures  . XR Ankle 2 Views Right   No orders of the defined types were placed in this encounter.    Procedures: No procedures performed  Clinical Data: No additional findings.  ROS:  All other systems negative, except as noted in the HPI. Review of Systems  Objective: Vital Signs: Ht 5\' 6"  (1.676 m)   Wt 158 lb (71.7 kg)   BMI 25.50 kg/m   Specialty Comments:  No specialty comments available.  PMFS History: Patient Active Problem List   Diagnosis Date Noted  . GERD (gastroesophageal reflux disease)   . Vitamin D deficiency   . Shingles   . CAD (coronary artery disease), native coronary artery 06/17/2014  . Dyslipidemia, goal LDL below 70 06/17/2014  . Benign essential HTN 06/17/2014   Past Medical History:  Diagnosis Date  . Benign essential HTN 06/17/2014  . CAD (coronary artery disease), native coronary artery 06/17/2014   S/p PCI of LAD 2008 in [place].  Nuclear stress test 2013 with no ischemia and EF 64%  . Dyslipidemia, goal LDL below 70 06/17/2014  . GERD (gastroesophageal reflux disease)   . Hyperlipidemia   . Hypertension   . Shingles   . Vitamin D deficiency     Family History  Problem Relation Age of Onset  . Hyperlipidemia Mother   . Dementia Mother   . Angina Father   . Heart disease Father     Past Surgical History:  Procedure Laterality Date  . CARDIAC CATHETERIZATION    . CORONARY ANGIOPLASTY  2008   PCI of LAD [facility]  . FOOT SURGERY Right 06/07/2015  . TONSILLECTOMY    . VASECTOMY     Social History   Occupational History  . 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 Smoking status: Never Smoker  . Smokeless tobacco: Former Engineer, water and Sexual Activity  . Alcohol use: Yes    Alcohol/week: 0.0 standard drinks    Comment: occasional  . Drug use: No  . Sexual activity: Not on file

## 2017-09-05 ENCOUNTER — Ambulatory Visit (INDEPENDENT_AMBULATORY_CARE_PROVIDER_SITE_OTHER): Payer: BLUE CROSS/BLUE SHIELD | Admitting: Orthopedic Surgery

## 2017-09-05 ENCOUNTER — Encounter (INDEPENDENT_AMBULATORY_CARE_PROVIDER_SITE_OTHER): Payer: Self-pay | Admitting: Orthopedic Surgery

## 2017-09-05 DIAGNOSIS — M25871 Other specified joint disorders, right ankle and foot: Secondary | ICD-10-CM

## 2017-09-05 MED ORDER — METHYLPREDNISOLONE ACETATE 40 MG/ML IJ SUSP
40.0000 mg | INTRAMUSCULAR | Status: AC | PRN
  Administered 2017-09-05: 40 mg via INTRA_ARTICULAR

## 2017-09-05 MED ORDER — LIDOCAINE HCL 1 % IJ SOLN
2.0000 mL | INTRAMUSCULAR | Status: AC | PRN
  Administered 2017-09-05: 2 mL

## 2017-09-05 NOTE — Progress Notes (Signed)
Office Visit Note   Patient: Luis Zamora           Date of Birth: Dec 26, 1960           MRN: [id number] Visit Date: 09/05/2017              Requested by: [name], [name], MD [phone] [place], [place] [address] PCP: [name], [name], MD  Chief Complaint  Patient presents with  . Right Ankle - Pain      HPI: Patient is a 57 year old gentleman is seen in follow-up for impingement syndrome right ankle.  Patient states the pain is worsened and he would like to consider injection.  Assessment & Plan: Visit Diagnoses:  1. Impingement of right ankle joint     Plan: Right ankle was injected follow-up in 4 weeks.  Discussed that if we do not get enough relief with injections we could proceed with arthroscopic intervention discussed that he would be out of work for about 5 days.  Follow-Up Instructions: Return in about 4 weeks (around 10/03/2017).   Ortho Exam  Patient is alert, oriented, no adenopathy, well-dressed, normal affect, normal respiratory effort. Examination patient has good pulses there is no dystrophic changes there is no cellulitis.  He is point tender to palpation over the anterior lateral joint line.  Sinus Tarsi is nontender to palpation.  The peroneal and posterior tibial tendons are nontender to palpation.  Imaging: No results found. No images are attached to the encounter.  Labs: No results found for: HGBA1C, ESRSEDRATE, CRP, LABURIC, REPTSTATUS, GRAMSTAIN, CULT, LABORGA   No results found for: ALBUMIN, PREALBUMIN, LABURIC  There is no height or weight on file to calculate BMI.  Orders:  No orders of the defined types were placed in this encounter.  No orders of the defined types were placed in this encounter.    Procedures: Medium Joint Inj: R ankle on 09/05/2017 9:37 AM Indications: pain and diagnostic evaluation Details: 22 G 1.5 in needle, anteromedial approach Medications: 2 mL lidocaine 1 %; 40 mg methylPREDNISolone acetate 40  MG/ML Outcome: tolerated well, no immediate complications Procedure, treatment alternatives, risks and benefits explained, specific risks discussed. Consent was given by the patient. Immediately prior to procedure a time out was called to verify the correct patient, procedure, equipment, support staff and site/side marked as required. Patient was prepped and draped in the usual sterile fashion.      Clinical Data: No additional findings.  ROS:  All other systems negative, except as noted in the HPI. Review of Systems  Objective: Vital Signs: There were no vitals taken for this visit.  Specialty Comments:  No specialty comments available.  PMFS History: Patient Active Problem List   Diagnosis Date Noted  . GERD (gastroesophageal reflux disease)   . Vitamin D deficiency   . Shingles   . CAD (coronary artery disease), native coronary artery 06/17/2014  . Dyslipidemia, goal LDL below 70 06/17/2014  . Benign essential HTN 06/17/2014   Past Medical History:  Diagnosis Date  . Benign essential HTN 06/17/2014  . CAD (coronary artery disease), native coronary artery 06/17/2014   S/[place] PCI of LAD 2008 in [place].  Nuclear stress test 2013 with no ischemia and EF 64%  . Dyslipidemia, goal LDL below 70 06/17/2014  . GERD (gastroesophageal reflux disease)   . Hyperlipidemia   . Hypertension   . Shingles   . Vitamin D deficiency     Family History  Problem Relation Age of Onset  .  Hyperlipidemia Mother   . Dementia Mother   . Angina Father   . Heart disease Father     Past Surgical History:  Procedure Laterality Date  . CARDIAC CATHETERIZATION    . CORONARY ANGIOPLASTY  2008   PCI of LAD [facility]  . FOOT SURGERY Right 06/07/2015  . TONSILLECTOMY    . VASECTOMY     Social History   Occupational History  . 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 Smoking status: Never Smoker  . Smokeless tobacco: Former Engineer, water and Sexual Activity  . Alcohol use: Yes     Alcohol/week: 0.0 standard drinks    Comment: occasional  . Drug use: No  . Sexual activity: Not on file

## 2017-09-25 ENCOUNTER — Other Ambulatory Visit: Payer: Self-pay | Admitting: Cardiovascular Disease

## 2017-11-05 ENCOUNTER — Other Ambulatory Visit: Payer: Self-pay | Admitting: Cardiovascular Disease

## 2017-11-28 ENCOUNTER — Ambulatory Visit: Payer: BLUE CROSS/BLUE SHIELD | Admitting: Cardiovascular Disease

## 2017-12-04 ENCOUNTER — Telehealth: Payer: Self-pay | Admitting: Cardiovascular Disease

## 2017-12-04 NOTE — Telephone Encounter (Signed)
New Message:   Patient calling concerning a appt. Please call patient back.

## 2017-12-04 NOTE — Telephone Encounter (Signed)
Pt unable to be here for appointment on 12/09/17.  He is having lab work done the following week and would like to wait until results available to see Dr. Clifton JamesMcAlhany.  I scheduled appointment for 02/06/17 and placed pt on wait list.

## 2017-12-09 ENCOUNTER — Ambulatory Visit: Payer: BLUE CROSS/BLUE SHIELD | Admitting: Cardiovascular Disease

## 2017-12-20 ENCOUNTER — Other Ambulatory Visit: Payer: Self-pay | Admitting: Cardiovascular Disease

## 2018-02-05 NOTE — Progress Notes (Signed)
Chief Complaint  Patient presents with  . Follow-up    CAD   History of Present Illness: 58 yo male with history of HTN, CAD with prior PCI, HLD and GERD who is here today for cardiac follow up. I met him in July 2017. He had been previously followed by Dr. Radford Pax in our office. He has had remote stenting of the LAD with a drug eluting stent in 2008 in [place], [place]. He had right sided chest pain at the time. He has had several normal stress tests since then. Seen in th ED September 2017 with atypical left sided chest pain felt to be musculoskeletal. Troponin negative. Echo August 2018 with LVEF=55-60%. Trivial AI. Mild MR. Exercise stress test August 2018 with no ischemia.   He is here today for follow up. The patient denies any chest pain, dyspnea, palpitations, lower extremity edema, orthopnea, PND, dizziness, near syncope or syncope.   Primary Care Physician: Donald Prose, MD   Past Medical History:  Diagnosis Date  . Benign essential HTN 06/17/2014  . CAD (coronary artery disease), native coronary artery 06/17/2014   S/p PCI of LAD 2008 in [place].  Nuclear stress test 2013 with no ischemia and EF 64%  . Dyslipidemia, goal LDL below 70 06/17/2014  . GERD (gastroesophageal reflux disease)   . Hyperlipidemia   . Hypertension   . Shingles   . Vitamin D deficiency     Past Surgical History:  Procedure Laterality Date  . CARDIAC CATHETERIZATION    . CORONARY ANGIOPLASTY  2008   PCI of LAD [facility]  . FOOT SURGERY Right 06/07/2015  . TONSILLECTOMY    . VASECTOMY      Current Outpatient Medications  Medication Sig Dispense Refill  . amLODipine (NORVASC) 5 MG tablet TAKE 1 TABLET BY MOUTH DAILY 90 tablet 0  . aspirin (BAYER ASPIRIN EC LOW DOSE) 81 MG EC tablet Take 81 mg by mouth at bedtime. Swallow whole.     . Cholecalciferol (VITAMIN D-3) 1000 UNITS CAPS Take 1,000 Units by mouth every evening.     Marland Kitchen PLAVIX 75 MG tablet Take 1 tablet (75 mg total) by mouth daily. 90 tablet  3  . rosuvastatin (CRESTOR) 20 MG tablet Take 1 tablet (20 mg total) by mouth daily. Please keep upcoming appt in November for future refills. Thank you 90 tablet 0   No current facility-administered medications for this visit.     No Known Allergies  Social History   Socioeconomic History  . Marital status: Married    Spouse name: elaine  . Number of children: 2  . Years of education: college  . Highest education level: Not on file  Occupational History  . 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 Financial resource strain: Not on file  . Food insecurity:    Worry: Not on file    Inability: Not on file  . Transportation needs:    Medical: Not on file    Non-medical: Not on file  Tobacco Use  . Smoking status: Never Smoker  . Smokeless tobacco: Former Network engineer and Sexual Activity  . Alcohol use: Yes    Alcohol/week: 0.0 standard drinks    Comment: occasional  . Drug use: No  . Sexual activity: Not on file  Lifestyle  . Physical activity:    Days per week: Not on file    Minutes per session: Not on file  . Stress: Not on file  Relationships  . Social connections:  Talks on phone: Not on file    Gets together: Not on file    Attends religious service: Not on file    Active member of club or organization: Not on file    Attends meetings of clubs or organizations: Not on file    Relationship status: Not on file  . Intimate partner violence:    Fear of current or ex partner: Not on file    Emotionally abused: Not on file    Physically abused: Not on file    Forced sexual activity: Not on file  Other Topics Concern  . Not on file  Social History Narrative  . Not on file    Family History  Problem Relation Age of Onset  . Hyperlipidemia Mother   . Dementia Mother   . Angina Father   . Heart disease Father     Review of Systems:  As stated in the HPI and otherwise negative.   BP 123/82   Pulse (!) 52   Ht _0  (1.676 m)   Wt 161 lb 9.6 oz  (73.3 kg)   SpO2 98%   BMI 26.08 kg/m   Physical Examination:  General: Well developed, well nourished, NAD  HEENT: OP clear, mucus membranes moist  SKIN: warm, dry. No rashes. Neuro: No focal deficits  Musculoskeletal: Muscle strength 5/5 all ext  Psychiatric: Mood and affect normal  Neck: No JVD, no carotid bruits, no thyromegaly, no lymphadenopathy.  Lungs:Clear bilaterally, no wheezes, rhonci, crackles Cardiovascular: Regular rate and rhythm. No murmurs, gallops or rubs. Abdomen:Soft. Bowel sounds present. Non-tender.  Extremities: No lower extremity edema. Pulses are 2 + in the bilateral DP/PT.  EKG:  EKG is ordered today. The ekg ordered today demonstrates Sinus bradycardia, rate 52 bpm  Echo August 2019: - Left ventricle: The cavity size was normal. Wall thickness was   normal. Systolic function was normal. The estimated ejection   fraction was in the range of 55% to 60%. Wall motion was normal;   there were no regional wall motion abnormalities. Features are   consistent with a pseudonormal left ventricular filling pattern,   with concomitant abnormal relaxation and increased filling   pressure (grade 2 diastolic dysfunction). - Aortic valve: There was no stenosis. There was trivial   regurgitation. - Mitral valve: There was mild regurgitation. - Left atrium: The atrium was mildly dilated. - Right ventricle: The cavity size was normal. Systolic function   was normal. - Tricuspid valve: Peak RV-RA gradient (S): 34 mm Hg. - Pulmonary arteries: PA peak pressure: 37 mm Hg (S). - Inferior vena cava: The vessel was normal in size. The   respirophasic diameter changes were in the normal range (>= 50%),   consistent with normal central venous pressure.   Recent Labs: No results found for requested labs within last 8760 hours.   Lipid Panel No results found for: CHOL, TRIG, HDL, CHOLHDL, VLDL, LDLCALC, LDLDIRECT   Wt Readings from Last 3 Encounters:  02/06/18 161 lb  9.6 oz (73.3 kg)  08/29/17 158 lb (71.7 kg)  08/17/16 158 lb 12.8 oz (72 kg)     Other studies Reviewed: Additional studies/ records that were reviewed today include: . Review of the above records demonstrates:   Assessment and Plan:   1. CAD without angina: He has no chest pain. Normal stress test in August 2018. Echo in August 2018 with normal LV systolic function. Will continue ASA, Plavix and statin.    2. HTN: BP is well controlled.  No changes  3. HLD: Lipids followed in primary care. LDL is up to 90. I discussed increasing his Crestor to 40 mg daily but he wishes to alter his diet first. Will continue Crestor 20 mg daily for now. Will check lipids in 12 weeks.   Current medicines are reviewed at length with the patient today.  The patient does not have concerns regarding medicines.  The following changes have been made:  no change  Labs/ tests ordered today include:   Orders Placed This Encounter  Procedures  . Lipid panel  . EKG 12-Lead    Disposition:   FU with me in 6  months  Signed, Lauree Chandler, MD 02/06/2018 9:07 AM    [facility] [address] [place], [place], [place]  [address] Phone: [phone]; Fax: [phone]

## 2018-02-06 ENCOUNTER — Encounter: Payer: Self-pay | Admitting: Cardiovascular Disease

## 2018-02-06 ENCOUNTER — Ambulatory Visit: Payer: BLUE CROSS/BLUE SHIELD | Admitting: Cardiovascular Disease

## 2018-02-06 VITALS — BP 123/82 | HR 52 | Ht 66.0 in | Wt 161.6 lb

## 2018-02-06 DIAGNOSIS — I10 Essential (primary) hypertension: Secondary | ICD-10-CM | POA: Diagnosis not present

## 2018-02-06 DIAGNOSIS — E785 Hyperlipidemia, unspecified: Secondary | ICD-10-CM | POA: Diagnosis not present

## 2018-02-06 DIAGNOSIS — I2510 Atherosclerotic heart disease of native coronary artery without angina pectoris: Secondary | ICD-10-CM

## 2018-02-06 NOTE — Patient Instructions (Signed)
Medication Instructions:  Your physician recommends that you continue on your current medications as directed. Please refer to the Current Medication list given to you today.  If you need a refill on your cardiac medications before your next appointment, please call your pharmacy.   Lab work: Your physician recommends that you return for lab work in: 12 weeks (Lipid).  Please make sure you are fasting for these labs (nothing to eat or drink after midnight except water and black coffee).  If you have labs (blood work) drawn today and your tests are completely normal, you will receive your results only by: Marland Kitchen MyChart Message (if you have MyChart) OR . A paper copy in the mail If you have any lab test that is abnormal or we need to change your treatment, we will call you to review the results.  Testing/Procedures: None  Follow-Up: At [facility], you and your health needs are our priority.  As part of our continuing mission to provide you with exceptional heart care, we have created designated Provider Care Teams.  These Care Teams include your primary Cardiologist (physician) and Advanced Practice Providers (APPs -  Physician Assistants and Nurse Practitioners) who all work together to provide you with the care you need, when you need it. You will need a follow up appointment in 6 months.  Please call our office 2 months in advance to schedule this appointment.  You may see Verne Carrow, MD or one of the following Advanced Practice Providers on your designated Care Team:   [place], PA-C Ronie Spies, PA-C . Jacolyn Reedy, PA-C  Any Other Special Instructions Will Be Listed Below (If Applicable).

## 2018-02-08 ENCOUNTER — Encounter

## 2018-03-19 ENCOUNTER — Ambulatory Visit: Payer: BLUE CROSS/BLUE SHIELD | Admitting: Cardiovascular Disease

## 2018-05-01 ENCOUNTER — Other Ambulatory Visit: Payer: BLUE CROSS/BLUE SHIELD

## 2018-05-08 ENCOUNTER — Other Ambulatory Visit: Payer: BLUE CROSS/BLUE SHIELD

## 2018-06-27 ENCOUNTER — Telehealth: Payer: Self-pay | Admitting: *Deleted

## 2018-06-27 NOTE — Telephone Encounter (Signed)
Pt due for follow up in late July.  I placed call to pt and left message asking him to contact office to schedule appointment

## 2018-07-22 NOTE — Telephone Encounter (Signed)
Left message to call office to schedule appointment
